# Patient Record
Sex: Female | Born: 1970
Health system: Southern US, Community
[De-identification: ages and names within clinical notes are randomized; demographics above are authoritative.]

## PROBLEM LIST (undated history)

## (undated) DIAGNOSIS — R87629 Unspecified abnormal cytological findings in specimens from vagina: Secondary | ICD-10-CM

## (undated) DIAGNOSIS — D219 Benign neoplasm of connective and other soft tissue, unspecified: Secondary | ICD-10-CM

## (undated) DIAGNOSIS — E119 Type 2 diabetes mellitus without complications: Secondary | ICD-10-CM

## (undated) DIAGNOSIS — E7800 Pure hypercholesterolemia, unspecified: Secondary | ICD-10-CM

## (undated) HISTORY — DX: Pure hypercholesterolemia, unspecified: E78.00

## (undated) HISTORY — DX: Benign neoplasm of connective and other soft tissue, unspecified: D21.9

## (undated) HISTORY — DX: Unspecified abnormal cytological findings in specimens from vagina: R87.629

---

## 1998-09-26 ENCOUNTER — Other Ambulatory Visit: Admission: RE | Admit: 1998-09-26 | Discharge: 1998-09-26 | Payer: Self-pay | Admitting: Family Medicine

## 1998-11-20 ENCOUNTER — Other Ambulatory Visit: Admission: RE | Admit: 1998-11-20 | Discharge: 1998-11-20 | Payer: Self-pay | Admitting: *Deleted

## 1999-09-25 ENCOUNTER — Other Ambulatory Visit: Admission: RE | Admit: 1999-09-25 | Discharge: 1999-09-25 | Payer: Self-pay | Admitting: Family Medicine

## 2000-09-01 ENCOUNTER — Other Ambulatory Visit: Admission: RE | Admit: 2000-09-01 | Discharge: 2000-09-01 | Payer: Self-pay | Admitting: Family Medicine

## 2001-12-20 ENCOUNTER — Other Ambulatory Visit: Admission: RE | Admit: 2001-12-20 | Discharge: 2001-12-20 | Payer: Self-pay | Admitting: Family Medicine

## 2003-01-11 ENCOUNTER — Other Ambulatory Visit: Admission: RE | Admit: 2003-01-11 | Discharge: 2003-01-11 | Payer: Self-pay | Admitting: Family Medicine

## 2005-12-30 ENCOUNTER — Ambulatory Visit (HOSPITAL_COMMUNITY): Admission: RE | Admit: 2005-12-30 | Discharge: 2005-12-30 | Payer: Self-pay | Admitting: Obstetrics

## 2007-11-25 ENCOUNTER — Ambulatory Visit (HOSPITAL_COMMUNITY): Admission: RE | Admit: 2007-11-25 | Discharge: 2007-11-25 | Payer: Self-pay | Admitting: Obstetrics

## 2015-07-03 ENCOUNTER — Ambulatory Visit (INDEPENDENT_AMBULATORY_CARE_PROVIDER_SITE_OTHER): Payer: BLUE CROSS/BLUE SHIELD | Admitting: Family Medicine

## 2015-07-03 VITALS — BP 106/64 | HR 84 | Temp 98.3°F | Resp 16 | Ht 64.0 in | Wt 208.0 lb

## 2015-07-03 DIAGNOSIS — E119 Type 2 diabetes mellitus without complications: Secondary | ICD-10-CM

## 2015-07-03 DIAGNOSIS — R252 Cramp and spasm: Secondary | ICD-10-CM

## 2015-07-03 DIAGNOSIS — R631 Polydipsia: Secondary | ICD-10-CM | POA: Diagnosis not present

## 2015-07-03 DIAGNOSIS — E669 Obesity, unspecified: Secondary | ICD-10-CM | POA: Diagnosis not present

## 2015-07-03 DIAGNOSIS — R358 Other polyuria: Secondary | ICD-10-CM

## 2015-07-03 DIAGNOSIS — H538 Other visual disturbances: Secondary | ICD-10-CM

## 2015-07-03 DIAGNOSIS — R3589 Other polyuria: Secondary | ICD-10-CM

## 2015-07-03 LAB — POCT CBC
GRANULOCYTE PERCENT: 45.7 % (ref 37–80)
HCT, POC: 37.2 % — AB (ref 37.7–47.9)
Hemoglobin: 13 g/dL (ref 12.2–16.2)
LYMPH, POC: 2.5 (ref 0.6–3.4)
MCH, POC: 28.6 pg (ref 27–31.2)
MCHC: 35 g/dL (ref 31.8–35.4)
MCV: 81.7 fL (ref 80–97)
MID (CBC): 0.2 (ref 0–0.9)
MPV: 9.3 fL (ref 0–99.8)
PLATELET COUNT, POC: 234 10*3/uL (ref 142–424)
POC Granulocyte: 2.3 (ref 2–6.9)
POC LYMPH %: 50.8 % — AB (ref 10–50)
POC MID %: 3.5 %M (ref 0–12)
RBC: 4.55 M/uL (ref 4.04–5.48)
RDW, POC: 14.3 %
WBC: 5 10*3/uL (ref 4.6–10.2)

## 2015-07-03 LAB — LIPID PANEL
CHOLESTEROL: 173 mg/dL (ref 125–200)
HDL: 43 mg/dL — ABNORMAL LOW (ref >=46)
LDL Cholesterol: 116 mg/dL (ref <130)
Total CHOL/HDL Ratio: 4 Ratio (ref <=5.0)
Triglycerides: 69 mg/dL (ref <150)
VLDL: 14 mg/dL (ref <30)

## 2015-07-03 LAB — COMPREHENSIVE METABOLIC PANEL
ALK PHOS: 111 U/L (ref 33–115)
ALT: 20 U/L (ref 6–29)
AST: 23 U/L (ref 10–30)
Albumin: 4.2 g/dL (ref 3.6–5.1)
BILIRUBIN TOTAL: 0.4 mg/dL (ref 0.2–1.2)
BUN: 10 mg/dL (ref 7–25)
CO2: 23 mmol/L (ref 20–31)
CREATININE: 0.69 mg/dL (ref 0.50–1.10)
Calcium: 9.6 mg/dL (ref 8.6–10.2)
Chloride: 100 mmol/L (ref 98–110)
GLUCOSE: 268 mg/dL — AB (ref 65–99)
Potassium: 4.7 mmol/L (ref 3.5–5.3)
SODIUM: 135 mmol/L (ref 135–146)
TOTAL PROTEIN: 7.8 g/dL (ref 6.1–8.1)

## 2015-07-03 LAB — MICROALBUMIN, URINE: MICROALB UR: 8.7 mg/dL

## 2015-07-03 LAB — GLUCOSE, POCT (MANUAL RESULT ENTRY): POC Glucose: 300 mg/dl — AB (ref 70–99)

## 2015-07-03 LAB — TSH: TSH: 2.113 u[IU]/mL (ref 0.350–4.500)

## 2015-07-03 LAB — POCT GLYCOSYLATED HEMOGLOBIN (HGB A1C): HEMOGLOBIN A1C: 11.2

## 2015-07-03 LAB — HEMOGLOBIN A1C: Hgb A1c MFr Bld: 11.2 % — AB (ref 4.0–6.0)

## 2015-07-03 MED ORDER — BLOOD GLUCOSE METER KIT: PACK | Status: DC

## 2015-07-03 MED ORDER — METFORMIN HCL 500 MG PO TABS: 500.0000 mg | ORAL_TABLET | Freq: Two times a day (BID) | ORAL | Status: DC

## 2015-07-03 NOTE — Patient Instructions (Addendum)
Read the American Diabetic Association website online. It has a lot of good material, and you should read as much as you can over and over.  Begin making dietary changes as discussed  Get daily exercise as discussed  Take the metformin 500 mg 1 daily before breakfast and before supper. Should you have forgotten to take it at the mealtime take it as soon as you remember to.   After about 2 weeks increase to 2 pills twice daily. If you get stomach upset or diarrhea from the medicine, gradually increase it on a slower schedule. The stomach usually adapts to this medicine.  Drink plenty of fluids  Take one 81 mg aspirin daily  Weight yourself regularly a couple of times a week and write down your weight. This will help you keep more disciplined in eating less and satting your goals of weight loss. You should be able to lose 1-2 pounds a week if you are being conscientious.  All diabetics are recommended to get a good eye examination. This should be done on a yearly basis.  Return in 3-4 weeks for a recheck.  Check your blood sugars on your home blood sugar meter and keep a record of them. Please try and check them before breakfast and 2-3 hours after your main dinner meal. The goal for the morning fasting sugar initially should be between 100 and 150 and in the evening less than 200. Ultimately [place] lower than that.

## 2015-07-03 NOTE — Progress Notes (Signed)
Patient ID: Haley Steele, female    DOB: 10/05/1970  Age: 44 y.o. MRN: [id number]  Chief Complaint  Patient presents with  . Diabetes    Saw OBG Mon and her sugar was 290-wants DM check     Subjective:   Patient is here for initial visit. She has been having problems with leg cramps. When she saw her OB/GYN he did additional labs on her and found her to have an elevated sugar. Told her that she had diabetes and needs to see a primary care doctor to get started on medication. She has never known that she was diabetic. There is no family history of diabetes. She has gained about 30 or 40 pounds over the last year by eating too much. She enjoys eating and shopping. She works a job. She lives alone. She has a couple of sisters but they do not have diabetes. Both parents are deceased and did not have diabetes that she knows of. She does not smoke, drink, or use substances. She does not do any regular exercise. She has had blurred vision, polydipsia, polyuria, and the cramping. She denies any headache or dizziness. No chest pains or palpitations. No shortness of breath. No GI or GU you complaints other than the urinary frequency. Musculoskeletal has been having cramps in her feet and legs as noted above. Dermatologic unremarkable. Neurologic unremarkable. Psychiatric unremarkable. Endocrinologic unremarkable. Allergy/immunology unremarkable. She works as an Optometrist for [employer].  Current allergies, medications, problem list, past/family and social histories reviewed.  Objective:  BP 106/64 mmHg  Pulse 84  Temp(Src) 98.3 F (36.8 C)  Resp 16  Ht 5\' 4"  (1.626 m)  Wt 208 lb (94.348 kg)  BMI 35.69 kg/m2  SpO2 96%  LMP 06/02/2015 (Approximate)  No major acute distress. Overweight lady. TMs normal. Eyes PERRLA. Throat clear. Neck supple without nodes or thyromegaly. No carotid bruits. Chest is clear to auscultation. Heart regular without murmurs gallops or arrhythmias. Abdomen  soft without mass or tenderness. Extremities without edema. Good pedal pulses. Sensory normal and the feet with normal filament test.  Assessment & Plan:   Assessment: 1. Type 2 diabetes mellitus without complication, without long-term current use of insulin ([place])   2. Polydipsia   3. Polyuria   4. Obesity   5. Blurred vision   6. Cramp of both lower extremities       Plan: Check labs. Had a 25 minute counseling discussion about diabetes, what it is, what needs to be worried about, etc.  Orders Placed This Encounter  Procedures  . Comprehensive metabolic panel  . Lipid panel  . TSH  . Microalbumin, urine  . POCT CBC  . POCT glucose (manual entry)  . POCT glycosylated hemoglobin (Hb A1C)   Results for orders placed or performed in visit on 07/03/15  POCT CBC  Result Value Ref Range   WBC 5.0 4.6 - 10.2 K/uL   Lymph, poc 2.5 0.6 - 3.4   POC LYMPH PERCENT 50.8 (A) 10 - 50 %L   MID (cbc) 0.2 0 - 0.9   POC MID % 3.5 0 - 12 %M   POC Granulocyte 2.3 2 - 6.9   Granulocyte percent 45.7 37 - 80 %G   RBC 4.55 4.04 - 5.48 M/uL   Hemoglobin 13.0 12.2 - 16.2 g/dL   HCT, POC 37.2 (A) 37.7 - 47.9 %   MCV 81.7 80 - 97 fL   MCH, POC 28.6 27 - 31.2 pg   MCHC 35.0 31.8 -  35.4 g/dL   RDW, POC 14.3 %   Platelet Count, POC 234 142 - 424 K/uL   MPV 9.3 0 - 99.8 fL  POCT glucose (manual entry)  Result Value Ref Range   POC Glucose 300 (A) 70 - 99 mg/dl  POCT glycosylated hemoglobin (Hb A1C)  Result Value Ref Range   Hemoglobin A1C 11.2         Patient Instructions  Read the American Diabetic Association website online. It has a lot of good material, and you should read as much as you can over and over.  Begin making dietary changes as discussed  Get daily exercise as discussed  Take the metformin 500 mg 1 daily before breakfast and before supper. Should you have forgotten to take it at the mealtime take it as soon as you remember to.   After about 2 weeks increase to 2 pills  twice daily. If you get stomach upset or diarrhea from the medicine, gradually increase it on a slower schedule. The stomach usually adapts to this medicine.  Drink plenty of fluids  Take one 81 mg aspirin daily  Weight yourself regularly a couple of times a week and write down your weight. This will help you keep more disciplined in eating less and satting your goals of weight loss. You should be able to lose 1-2 pounds a week if you are being conscientious.  All diabetics are recommended to get a good eye examination. This should be done on a yearly basis.  Return in 3-4 weeks for a recheck.  Check your blood sugars on your home blood sugar meter and keep a record of them. Please try and check them before breakfast and 2-3 hours after your main dinner meal. The goal for the morning fasting sugar initially should be between 100 and 150 and in the evening less than 200. Ultimately [place] lower than that.      Return in about 4 weeks (around 07/31/2015).   HOPPER,DAVID, MD 07/03/2015

## 2015-07-10 ENCOUNTER — Encounter: Payer: Self-pay | Admitting: Family Medicine

## 2015-08-19 ENCOUNTER — Ambulatory Visit (INDEPENDENT_AMBULATORY_CARE_PROVIDER_SITE_OTHER): Payer: BLUE CROSS/BLUE SHIELD | Admitting: Family Medicine

## 2015-08-19 VITALS — BP 114/60 | HR 77 | Temp 97.9°F | Resp 18 | Ht 63.5 in | Wt 206.2 lb

## 2015-08-19 DIAGNOSIS — E119 Type 2 diabetes mellitus without complications: Secondary | ICD-10-CM | POA: Diagnosis not present

## 2015-08-19 LAB — POCT GLYCOSYLATED HEMOGLOBIN (HGB A1C): HEMOGLOBIN A1C: 9.1

## 2015-08-19 MED ORDER — ONETOUCH LANCETS MISC: Status: DC

## 2015-08-19 MED ORDER — METFORMIN HCL 500 MG PO TABS: 500.0000 mg | ORAL_TABLET | Freq: Two times a day (BID) | ORAL | Status: DC

## 2015-08-19 MED ORDER — GLUCOSE BLOOD VI STRP: ORAL_STRIP | Status: DC

## 2015-08-19 NOTE — Progress Notes (Signed)
Patient ID: Haley Steele, female    DOB: November 12, 1970  Age: 45 y.o. MRN: [id number]  Chief Complaint  Patient presents with  . Follow-up    diabetes    Subjective:   Here for a recheck of her diabetes. She was newly diagnosed in November. She is feeling much better. No longer having the polydipsia and polyuria. She has lost little weight though today she is bundled in cold weather closed. Her sugars run good at home. She needs some new supplies. She's been checking her sugars about 4 times a day. She showed me her meter and they are consistently doing well.  Current allergies, medications, problem list, past/family and social histories reviewed.  Objective:  BP 114/60 mmHg  Pulse 77  Temp(Src) 97.9 F (36.6 C) (Oral)  Resp 18  Ht 5' 3.5" (1.613 m)  Wt 206 lb 3.2 oz (93.532 kg)  BMI 35.95 kg/m2  SpO2 93%  Chest clear. Heart regular.  Results for orders placed or performed in visit on 08/19/15  POCT glycosylated hemoglobin (Hb A1C)  Result Value Ref Range   Hemoglobin A1C 9.1      Assessment & Plan:   Assessment: 1. Type 2 diabetes mellitus without complication, without long-term current use of insulin ([place])       Plan:   Orders Placed This Encounter  Procedures  . POCT glycosylated hemoglobin (Hb A1C)    Meds ordered this encounter  Medications  . metFORMIN (GLUCOPHAGE) 500 MG tablet    Sig: Take 1 tablet (500 mg total) by mouth 2 (two) times daily with a meal. Increase as directed.    Dispense:  180 tablet    Refill:  3  . glucose blood test strip    Sig: Use as instructed    Dispense:  100 each    Refill:  12    Needs One Touch Ultra Strips.  Look at her box.  . ONE TOUCH LANCETS MISC    Sig: Lancets    Dispense:  200 each    Refill:  12    One touch delica         Patient Instructions  Same treatment. Return in 3 months.     Return in about 3 months (around 11/17/2015).   Haley Gerber, MD 08/19/2015

## 2015-08-19 NOTE — Patient Instructions (Signed)
Same treatment. Return in 3 months.

## 2015-10-04 ENCOUNTER — Telehealth: Payer: Self-pay

## 2015-10-04 DIAGNOSIS — E119 Type 2 diabetes mellitus without complications: Secondary | ICD-10-CM

## 2015-10-04 MED ORDER — METFORMIN HCL 500 MG PO TABS: 1000.0000 mg | ORAL_TABLET | Freq: Two times a day (BID) | ORAL | Status: DC

## 2015-10-04 NOTE — Telephone Encounter (Signed)
Pt called and asked [place] to send in a new Rx for the increased dose of metformin that Dr Linna Darner had instr'd her to take, stating that she is now taking 2 tabs BID for total of #360 for 90 day supply. I checked plan under OV notes which stated: "After about 2 weeks increase to 2 pills twice daily". Sent in new Rx with a year of RFs as Dr hopper had given her at [facility] lower dose.

## 2016-02-10 DIAGNOSIS — Z01419 Encounter for gynecological examination (general) (routine) without abnormal findings: Secondary | ICD-10-CM | POA: Diagnosis not present

## 2016-02-10 DIAGNOSIS — E669 Obesity, unspecified: Secondary | ICD-10-CM | POA: Diagnosis not present

## 2016-02-10 DIAGNOSIS — D259 Leiomyoma of uterus, unspecified: Secondary | ICD-10-CM | POA: Diagnosis not present

## 2016-02-10 DIAGNOSIS — Z130 Encounter for screening for diseases of the blood and blood-forming organs and certain disorders involving the immune mechanism: Secondary | ICD-10-CM | POA: Diagnosis not present

## 2016-02-10 DIAGNOSIS — Z1389 Encounter for screening for other disorder: Secondary | ICD-10-CM | POA: Diagnosis not present

## 2016-02-10 DIAGNOSIS — Z124 Encounter for screening for malignant neoplasm of cervix: Secondary | ICD-10-CM | POA: Diagnosis not present

## 2016-06-16 DIAGNOSIS — Z1231 Encounter for screening mammogram for malignant neoplasm of breast: Secondary | ICD-10-CM | POA: Diagnosis not present

## 2016-07-08 DIAGNOSIS — L7211 Pilar cyst: Secondary | ICD-10-CM | POA: Diagnosis not present

## 2016-07-16 DIAGNOSIS — M7981 Nontraumatic hematoma of soft tissue: Secondary | ICD-10-CM | POA: Diagnosis not present

## 2016-07-20 ENCOUNTER — Other Ambulatory Visit: Payer: Self-pay | Admitting: Family Medicine

## 2016-07-20 DIAGNOSIS — E119 Type 2 diabetes mellitus without complications: Secondary | ICD-10-CM

## 2017-02-15 DIAGNOSIS — Z30011 Encounter for initial prescription of contraceptive pills: Secondary | ICD-10-CM | POA: Diagnosis not present

## 2017-02-15 DIAGNOSIS — Z3202 Encounter for pregnancy test, result negative: Secondary | ICD-10-CM | POA: Diagnosis not present

## 2017-02-15 DIAGNOSIS — Z124 Encounter for screening for malignant neoplasm of cervix: Secondary | ICD-10-CM | POA: Diagnosis not present

## 2017-02-15 DIAGNOSIS — D259 Leiomyoma of uterus, unspecified: Secondary | ICD-10-CM | POA: Diagnosis not present

## 2017-02-15 DIAGNOSIS — Z1389 Encounter for screening for other disorder: Secondary | ICD-10-CM | POA: Diagnosis not present

## 2017-02-15 DIAGNOSIS — Z130 Encounter for screening for diseases of the blood and blood-forming organs and certain disorders involving the immune mechanism: Secondary | ICD-10-CM | POA: Diagnosis not present

## 2017-02-15 DIAGNOSIS — Z01419 Encounter for gynecological examination (general) (routine) without abnormal findings: Secondary | ICD-10-CM | POA: Diagnosis not present

## 2017-02-15 DIAGNOSIS — E041 Nontoxic single thyroid nodule: Secondary | ICD-10-CM | POA: Diagnosis not present

## 2017-03-23 ENCOUNTER — Encounter: Payer: Self-pay | Admitting: Family Medicine

## 2017-03-23 ENCOUNTER — Ambulatory Visit (INDEPENDENT_AMBULATORY_CARE_PROVIDER_SITE_OTHER): Payer: BLUE CROSS/BLUE SHIELD | Admitting: Family Medicine

## 2017-03-23 VITALS — BP 122/80 | HR 82 | Temp 98.6°F | Resp 18 | Ht 63.5 in | Wt 213.4 lb

## 2017-03-23 DIAGNOSIS — E1165 Type 2 diabetes mellitus with hyperglycemia: Secondary | ICD-10-CM | POA: Diagnosis not present

## 2017-03-23 DIAGNOSIS — R030 Elevated blood-pressure reading, without diagnosis of hypertension: Secondary | ICD-10-CM

## 2017-03-23 DIAGNOSIS — H1033 Unspecified acute conjunctivitis, bilateral: Secondary | ICD-10-CM | POA: Diagnosis not present

## 2017-03-23 DIAGNOSIS — E119 Type 2 diabetes mellitus without complications: Secondary | ICD-10-CM | POA: Insufficient documentation

## 2017-03-23 MED ORDER — GENTAMICIN SULFATE 0.3 % OP SOLN: 2.0000 [drp] | OPHTHALMIC | 0 refills | Status: DC

## 2017-03-23 NOTE — Patient Instructions (Addendum)
I suspect this is viral but we are going to cover you with an antibiotic drop just in case. Make sure you get in all 6 doses of the antibiotic drops today and tomorrow so that we can get you back to work on Thursday - so today put in 1 drop into each eye every hour x 6 doses. Tomorrow put in 1 drop into each eye 6 times (breafast, mid-morning, lunch, mid-afternoon, dinner, before bed). Then continue every 4hours except while sleeping until sxs have been completely resolved for 2d.    If you are not getting significant improvement by Thursday, you can fill the rx for azelastine and start that as well.  Make sure you are doing warm wet compresses as often as possible - especially before the antibiotic drops - it will make them more effective. Make sure to wash your hands immed or hand sanitize anytime you touch your face.    IF you received an x-ray today, you will receive an invoice from [facility]. Please contact [facility] at [phone] with questions or concerns regarding your invoice.   IF you received labwork today, you will receive an invoice from [facility]. Please contact [facility] at [phone] with questions or concerns regarding your invoice.   Our billing staff will not be able to assist you with questions regarding bills from these companies.  You will be contacted with the lab results as soon as they are available. The fastest way to get your results is to activate your My Chart account. Instructions are located on the last page of this paperwork. If you have not heard from [place] regarding the results in 2 weeks, please contact this office.      Viral Conjunctivitis, Adult Viral conjunctivitis is an inflammation of the clear membrane that covers the white part of your eye and the inner surface of your eyelid (conjunctiva). The inflammation is caused by a viral infection. The blood vessels in the conjunctiva become inflamed, causing the eye to become red or pink, and  often itchy. Viral conjunctivitis can be easily passed from one person to another (is contagious). This condition is often called pink eye. What are the causes? This condition is caused by a virus. A virus is a type of contagious germ. It can be spread by touching objects that have been contaminated with the virus, such as doorknobs or towels. It can also be passed through droplets, such as from coughing or sneezing. What are the signs or symptoms? Symptoms of this condition include:  Eye redness.  Tearing or watery eyes.  Itchy and irritated eyes.  Burning feeling in the eyes.  Clear drainage from the eye.  Swollen eyelids.  A gritty feeling in the eye.  Light sensitivity.  This condition often occurs with other symptoms, such as a fever, nausea, or a rash. How is this diagnosed? This condition is diagnosed with a medical history and physical exam. If you have discharge from your eye, the discharge may be tested to rule out other causes of conjunctivitis. How is this treated? Viral conjunctivitis does not respond to medicines that kill bacteria (antibiotics). Treatment for viral conjunctivitis is directed at stopping a bacterial infection from developing in addition to the viral infection. Treatment also aims to relieve your symptoms, such as itching. This may be done with antihistamine drops or other eye medicines. Rarely, steroid eye drops or antiviral medicines may be prescribed. Follow these instructions at home: Medicines   Take or apply over-the-counter and prescription medicines only  as told by your health care provider.  Be very careful to avoid touching the edge of the eyelid with the eye drop bottle or ointment tube when applying medicines to the affected eye. Being careful this way will stop you from spreading the infection to the other eye or to other people. Eye care  Avoid touching or rubbing your eyes.  Apply a warm, wet, clean washcloth to your eye for 10-20  minutes, 3-4 times per day or as told by your health care provider.  If you wear contact lenses, do not wear them until the inflammation is gone and your health care provider says it is safe to wear them again. Ask your health care provider how to sterilize or replace your contact lenses before using them again. Wear glasses until you can resume wearing contacts.  Avoid wearing eye makeup until the inflammation is gone. Throw away any old eye cosmetics that may be contaminated.  Gently wipe away any drainage from your eye with a warm, wet washcloth or a cotton ball. General instructions  Change or wash your pillowcase every day or as told by your health care provider.  Do not share towels, pillowcases, washcloths, eye makeup, makeup brushes, contact lenses, or glasses. This may spread the infection.  Wash your hands often with soap and water. Use paper towels to dry your hands. If soap and water are not available, use hand sanitizer.  Try to avoid contact with other people for one week or as told by your health care provider. Contact a health care provider if:  Your symptoms do not improve with treatment or they get worse.  You have increased pain.  Your vision becomes blurry.  You have a fever.  You have facial pain, redness, or swelling.  You have yellow or green drainage coming from your eye.  You have new symptoms. This information is not intended to replace advice given to you by your health care provider. Make sure you discuss any questions you have with your health care provider. Document Released: 10/17/2002 Document Revised: 02/22/2016 Document Reviewed: 02/11/2016 Elsevier Interactive Patient Education  2017 [employer].

## 2017-03-23 NOTE — Progress Notes (Addendum)
Subjective:    Patient ID: Haley Steele, female    DOB: 07/30/1971, 46 y.o.   MRN: [id number] Chief Complaint  Patient presents with  . Conjunctivitis    both eyes started 5days ago but got worse 3days ago     HPI  Haley Steele is a delightful 46 yo woman who presents today w/ c/o eye redness and drainage. Started in right eye 5d ago and then 2d prior moved into left as well. No vision change. Drainage clear. No pain but a little itchy and gritty. No chance she could have scratched her eyes.  No URIs.  Does not wear glasses or contacts. Does not use eye makeup at all.  No known sick contacts but did start the day after she tried on glasses at [employer].  No past medical history on file. No past surgical history on file. Current Outpatient Prescriptions on File Prior to Visit  Medication Sig Dispense Refill  . blood glucose meter kit and supplies Dispense based on patient and insurance preference. Use up to four times daily as directed. (FOR ICD-9 250.00, 250.01). 1 each 0  . glucose blood test strip Use as instructed 100 each 12  . metFORMIN (GLUCOPHAGE) 500 MG tablet Take 2 tablets (1,000 mg total) by mouth 2 (two) times daily with a meal. 360 tablet 3  . ONE TOUCH LANCETS MISC Lancets 200 each 12   No current facility-administered medications on file prior to visit.    No Known Allergies No family history on file. Social History   Social History  . Marital status: Divorced    Spouse name: N/A  . Number of children: N/A  . Years of education: N/A   Social History Main Topics  . Smoking status: Never Smoker  . Smokeless tobacco: Never Used  . Alcohol use 0.0 oz/week  . Drug use: No  . Sexual activity: Not Asked   Other Topics Concern  . None   Social History Narrative  . None   Depression screen [facility] 2/9 03/23/2017 08/19/2015 07/03/2015  Decreased Interest 0 0 0  Down, Depressed, Hopeless 0 0 0  PHQ - 2 Score 0 0 0     Review of Systems See hpi      Objective:   Physical Exam  Constitutional: She is oriented to person, place, and time. She appears well-developed and well-nourished. No distress.  HENT:  Head: Normocephalic and atraumatic.  Right Ear: Tympanic membrane, external ear and ear canal normal.  Left Ear: Tympanic membrane, external ear and ear canal normal.  Nose: Nose normal. No mucosal edema or rhinorrhea.  Mouth/Throat: Uvula is midline, oropharynx is clear and moist and mucous membranes are normal. No oropharyngeal exudate.  Eyes: Pupils are equal, round, and reactive to light. EOM are normal. Right eye exhibits discharge. Right eye exhibits no chemosis and no exudate. Left eye exhibits discharge. Left eye exhibits no chemosis and no exudate. Right conjunctiva is injected. Left conjunctiva is injected. No scleral icterus.  Fundoscopic exam:      The right eye shows no exudate, no hemorrhage and no papilledema.       The left eye shows no exudate, no hemorrhage and no papilledema.  Bilateral watery discharge  Neck: Normal range of motion. Neck supple.  Cardiovascular: Normal rate, regular rhythm, normal heart sounds and intact distal pulses.   Pulmonary/Chest: Effort normal and breath sounds normal.  Lymphadenopathy:    She has no cervical adenopathy.  Neurological: She is alert and oriented to person, place,  and time.  Skin: Skin is warm and dry. She is not diaphoretic. No erythema.  Psychiatric: She has a normal mood and affect. Her behavior is normal.    BP 122/80   Pulse 82   Temp 98.6 F (37 C) (Oral)   Resp 18   Ht 5' 3.5" (1.613 m)   Wt 213 lb 6.4 oz (96.8 kg)   LMP 03/09/2017   SpO2 98%   BMI 37.21 kg/m       Visual Acuity Screening   Right eye Left eye Both eyes  Without correction:     With correction: '20/15 20/15 20/20 '    Assessment & Plan:   1. Acute bacterial conjunctivitis of both eyes   2. Type 2 diabetes mellitus with hyperglycemia, without long-term current use of insulin ([facility])   3.  Elevated blood pressure reading      Meds ordered this encounter  Medications  . norethindrone (HEATHER) 0.35 MG tablet    Sig: Take 1 tablet by mouth daily.  Marland Kitchen gentamicin (GARAMYCIN) 0.3 % ophthalmic solution    Sig: Place 2 drops into both eyes every 4 (four) hours.    Dispense:  15 mL    Refill:  0   Hand written rx for azelastine given 1 gtt into eye bid start in 2d if no substantial improvement on above  Delman Cheadle, M.D.  Primary Care at [facility] [address] [place], [place] [address] [phone] phone [phone] fax  03/25/17 11:31 PM

## 2017-03-23 NOTE — Progress Notes (Deleted)
   Subjective:    Patient ID: Haley Steele, female    DOB: August 02, 1971, 46 y.o.   MRN: [id number]  HPI    Review of Systems     Objective:   Physical Exam    BP (!) 144/84   Pulse 82   Temp 98.6 F (37 C) (Oral)   Resp 18   Ht 5' 3.5" (1.613 m)   Wt 213 lb 6.4 oz (96.8 kg)   LMP 03/09/2017   SpO2 98%   BMI 37.21 kg/m      Assessment & Plan:

## 2017-03-24 DIAGNOSIS — E041 Nontoxic single thyroid nodule: Secondary | ICD-10-CM | POA: Diagnosis not present

## 2017-03-26 ENCOUNTER — Other Ambulatory Visit (HOSPITAL_COMMUNITY): Payer: Self-pay | Admitting: Obstetrics and Gynecology

## 2017-03-26 DIAGNOSIS — E041 Nontoxic single thyroid nodule: Secondary | ICD-10-CM

## 2017-03-31 ENCOUNTER — Ambulatory Visit (HOSPITAL_COMMUNITY)
Admission: RE | Admit: 2017-03-31 | Discharge: 2017-03-31 | Disposition: A | Discharge status: Home / Self Care | Payer: BLUE CROSS/BLUE SHIELD | Source: Ambulatory Visit | Attending: Obstetrics and Gynecology | Admitting: Obstetrics and Gynecology

## 2017-03-31 DIAGNOSIS — E041 Nontoxic single thyroid nodule: Secondary | ICD-10-CM | POA: Insufficient documentation

## 2017-04-06 ENCOUNTER — Other Ambulatory Visit: Payer: Self-pay | Admitting: Obstetrics and Gynecology

## 2017-04-06 DIAGNOSIS — E041 Nontoxic single thyroid nodule: Secondary | ICD-10-CM

## 2017-04-21 ENCOUNTER — Ambulatory Visit
Admission: RE | Admit: 2017-04-21 | Discharge: 2017-04-21 | Disposition: A | Discharge status: Home / Self Care | Payer: BLUE CROSS/BLUE SHIELD | Source: Ambulatory Visit | Attending: Obstetrics and Gynecology | Admitting: Obstetrics and Gynecology

## 2017-04-21 ENCOUNTER — Other Ambulatory Visit (HOSPITAL_COMMUNITY)
Admission: RE | Admit: 2017-04-21 | Discharge: 2017-04-21 | Disposition: A | Discharge status: Home / Self Care | Payer: BLUE CROSS/BLUE SHIELD | Source: Ambulatory Visit | Attending: General Surgery | Admitting: General Surgery

## 2017-04-21 ENCOUNTER — Other Ambulatory Visit: Payer: Self-pay | Admitting: Obstetrics and Gynecology

## 2017-04-21 DIAGNOSIS — E041 Nontoxic single thyroid nodule: Secondary | ICD-10-CM

## 2017-04-21 DIAGNOSIS — R946 Abnormal results of thyroid function studies: Secondary | ICD-10-CM | POA: Diagnosis not present

## 2017-10-15 DIAGNOSIS — Z113 Encounter for screening for infections with a predominantly sexual mode of transmission: Secondary | ICD-10-CM | POA: Diagnosis not present

## 2017-10-15 DIAGNOSIS — N762 Acute vulvitis: Secondary | ICD-10-CM | POA: Diagnosis not present

## 2017-10-25 ENCOUNTER — Other Ambulatory Visit (HOSPITAL_COMMUNITY): Payer: Self-pay | Admitting: Obstetrics and Gynecology

## 2017-10-25 DIAGNOSIS — E041 Nontoxic single thyroid nodule: Secondary | ICD-10-CM

## 2017-11-04 DIAGNOSIS — R202 Paresthesia of skin: Secondary | ICD-10-CM | POA: Insufficient documentation

## 2017-11-07 ENCOUNTER — Encounter: Payer: Self-pay | Admitting: Emergency Medicine

## 2017-11-07 ENCOUNTER — Emergency Department (INDEPENDENT_AMBULATORY_CARE_PROVIDER_SITE_OTHER)
Admission: EM | Admit: 2017-11-07 | Discharge: 2017-11-07 | Disposition: A | Discharge status: Home / Self Care | Payer: BLUE CROSS/BLUE SHIELD | Source: Home / Self Care | Attending: Emergency Medicine | Admitting: Emergency Medicine

## 2017-11-07 ENCOUNTER — Other Ambulatory Visit: Payer: Self-pay

## 2017-11-07 DIAGNOSIS — R6889 Other general symptoms and signs: Secondary | ICD-10-CM | POA: Diagnosis not present

## 2017-11-07 HISTORY — DX: Type 2 diabetes mellitus without complications: E11.9

## 2017-11-07 NOTE — ED Triage Notes (Signed)
Patient has felt fatigued, aching, with occasional chills and slight cough for past 3 days.

## 2017-11-07 NOTE — ED Provider Notes (Signed)
[name]    CSN: [id number] Arrival date & time: 11/07/17  1803  Patient presents to [facility] Sunday, 6:03 PM   History   Chief Complaint Chief Complaint  Patient presents with  . Generalized Body Aches  . Fatigue    HPI Haley Steele is a 47 y.o. female.   HPI Symptoms started 4 days ago.  She had just returned from [facility] 4 days ago, onset of fever, chills, sweats, myalgias.  Nonproductive cough. These symptoms were moderately severe and lasted 2 days, then improved.  Last fever was over 24 hours ago. She complains of vague fatigue, mild myalgias, without swollen joints or rash.  No history of tick or insect bite. No wheezing or chest pain or shortness of breath or abdominal pain or nausea or vomiting or GU symptoms. Denies blurry vision or polyuria or polydipsia Past Medical History:  Diagnosis Date  . Diabetes mellitus without complication [facility])   Per patient questioning, she states that she has prediabetes  Patient Active Problem List   Diagnosis Date Noted  . Type 2 diabetes mellitus ([place]) 03/23/2017    History reviewed. No pertinent surgical history. Denies any surgeries OB History   None    LMP 10/24/17.  Denies chance of pregnancy  Home Medications    Prior to Admission medications   Medication Sig Start Date End Date Taking? Authorizing Provider  blood glucose meter kit and supplies Dispense based on patient and insurance preference. Use up to four times daily as directed. (FOR ICD-9 250.00, 250.01). 07/03/15   Posey Boyer, MD  gentamicin (GARAMYCIN) 0.3 % ophthalmic solution Place 2 drops into both eyes every 4 (four) hours. 03/23/17   Shawnee Knapp, MD  glucose blood test strip Use as instructed 08/19/15   Posey Boyer, MD  metFORMIN (GLUCOPHAGE) 500 MG tablet Take 2 tablets (1,000 mg total) by mouth 2 (two) times daily with a meal. 10/04/15   Posey Boyer, MD  norethindrone (HEATHER) 0.35 MG tablet Take 1  tablet by mouth daily.    [provider]  ONE TOUCH LANCETS MISC Lancets 08/19/15   Posey Boyer, MD    Family History No family history on file.  Social History Social History   Tobacco Use  . Smoking status: Never Smoker  . Smokeless tobacco: Never Used  Substance Use Topics  . Alcohol use: Yes    Alcohol/week: 0.0 oz  . Drug use: No     Allergies   Patient has no known allergies.   Review of Systems Review of Systems  Constitutional: Negative for diaphoresis.  HENT: Positive for congestion (Mild). Negative for nosebleeds, rhinorrhea, sinus pain, sore throat and trouble swallowing.   Eyes: Negative.   Respiratory: Positive for cough. Negative for chest tightness, shortness of breath and wheezing.   Cardiovascular: Negative for chest pain, palpitations and leg swelling.  Gastrointestinal: Negative for abdominal pain, diarrhea, nausea and vomiting.  Genitourinary: Negative.   Musculoskeletal: Positive for myalgias. Negative for neck stiffness.  Neurological: Negative for dizziness, seizures, syncope and light-headedness.  Hematological: Negative for adenopathy.  Psychiatric/Behavioral: Negative for confusion and hallucinations.  All other systems reviewed and are negative.    Physical Exam Triage Vital Signs ED Triage Vitals  Enc Vitals Group     BP 11/07/17 1820 119/80     Pulse Rate 11/07/17 1820 88     Resp 11/07/17 1820 16     Temp 11/07/17 1820 97.9 F (36.6 C)  Temp Source 11/07/17 1820 Oral     SpO2 11/07/17 1820 98 %     Weight 11/07/17 1821 176 lb (79.8 kg)     Height 11/07/17 1821 '5\' 4"'  (1.626 m)     Head Circumference --      Peak Flow --      Pain Score 11/07/17 1820 1     Pain Loc --      Pain Edu? --      Excl. in [place]? --    No data found.  Updated Vital Signs BP 119/80 (BP Location: Right Arm)   Pulse 88   Temp 97.9 F (36.6 C) (Oral)   Resp 16   Ht '5\' 4"'  (1.626 m)   Wt 176 lb (79.8 kg)   LMP 10/24/2017 (Exact Date)    SpO2 98%   BMI 30.21 kg/m   Visual Acuity Right Eye Distance:   Left Eye Distance:   Bilateral Distance:    Right Eye Near:   Left Eye Near:    Bilateral Near:     Physical Exam  Constitutional: She is oriented to person, place, and time. She appears well-developed and well-nourished. No distress.  HENT:  Head: Normocephalic and atraumatic.  Nose: Nose normal.  Mouth/Throat: Oropharynx is clear and moist.  Ears normal  Eyes: Pupils are equal, round, and reactive to light. Conjunctivae are normal. No scleral icterus.  Neck: Normal range of motion. Neck supple. No JVD present.  Cardiovascular: Normal rate and regular rhythm.  No murmur heard. Pulmonary/Chest: Effort normal and breath sounds normal.  Occasional cough noted  Abdominal: She exhibits no distension. There is no tenderness.  Lymphadenopathy:    She has no cervical adenopathy.  Neurological: She is alert and oriented to person, place, and time. No cranial nerve deficit.  Skin: Skin is warm and dry. Capillary refill takes less than 2 seconds. No rash noted. She is not diaphoretic.  Psychiatric: She has a normal mood and affect. Her behavior is normal.  Vitals reviewed.    UC Treatments / Results  Labs (all labs ordered are listed, but only abnormal results are displayed) Labs Reviewed  POCT INFLUENZA A/B    EKG None Radiology No results found.  Procedures Procedures (including critical care time)  Medications Ordered in UC Medications - No data to display   Initial Impression / Assessment and Plan / UC Course  I have reviewed the triage vital signs and the nursing notes.  Pertinent labs & imaging results that were available during my care of the patient were reviewed by me and considered in my medical decision making (see chart for details).     Rapid flu test negative. He declined any other testing. She likely has viral syndrome which is improving on her own. We discussed OTC treatment and  symptomatic care. Red flags discussed. Urged her to follow-up with PCP this week to follow-up prediabetes and other preventive and primary care  Final Clinical Impressions(s) / UC Diagnoses   Final diagnoses:  Flu-like symptoms    ED Discharge Orders    None       Controlled Substance Prescriptions [place] Controlled Substance Registry consulted? Not Applicable   Jacqulyn Cane, MD 11/07/17 2109

## 2017-11-08 LAB — POCT INFLUENZA A/B
Influenza A, POC: NEGATIVE
Influenza B, POC: NEGATIVE

## 2017-11-10 ENCOUNTER — Ambulatory Visit (HOSPITAL_COMMUNITY): Admission: RE | Admit: 2017-11-10 | Payer: BLUE CROSS/BLUE SHIELD | Source: Ambulatory Visit

## 2017-11-18 ENCOUNTER — Ambulatory Visit (INDEPENDENT_AMBULATORY_CARE_PROVIDER_SITE_OTHER): Payer: BLUE CROSS/BLUE SHIELD | Admitting: Osteopathic Medicine

## 2017-11-18 ENCOUNTER — Encounter: Payer: Self-pay | Admitting: Osteopathic Medicine

## 2017-11-18 VITALS — BP 135/83 | HR 82 | Temp 98.0°F | Ht 64.0 in | Wt 177.1 lb

## 2017-11-18 DIAGNOSIS — R51 Headache: Secondary | ICD-10-CM | POA: Diagnosis not present

## 2017-11-18 DIAGNOSIS — R202 Paresthesia of skin: Secondary | ICD-10-CM

## 2017-11-18 DIAGNOSIS — R200 Anesthesia of skin: Secondary | ICD-10-CM | POA: Diagnosis not present

## 2017-11-18 DIAGNOSIS — E119 Type 2 diabetes mellitus without complications: Secondary | ICD-10-CM

## 2017-11-18 DIAGNOSIS — Z3041 Encounter for surveillance of contraceptive pills: Secondary | ICD-10-CM | POA: Diagnosis not present

## 2017-11-18 DIAGNOSIS — R519 Headache, unspecified: Secondary | ICD-10-CM

## 2017-11-18 DIAGNOSIS — Z8639 Personal history of other endocrine, nutritional and metabolic disease: Secondary | ICD-10-CM | POA: Diagnosis not present

## 2017-11-18 DIAGNOSIS — G8929 Other chronic pain: Secondary | ICD-10-CM

## 2017-11-18 NOTE — Progress Notes (Signed)
HPI: Haley Steele is a 47 y.o. female who  has a past medical history of Diabetes mellitus without complication ([place]).  she presents to [facility] today, 11/18/17,  for chief complaint of: Establish care Check-up   History of elevated sugars: Prediabetes per patient, was on metformin which caused some problems for her. On my review of the A1c, we discussed that these are typically in the diabetic range in her case.  History of thyroid nodule: Biopsy was normal.  Reports increased thirst lately.eports occasional blurry ision, occasionally normal. No recent eye exam. Headaches come and go, no stabbing pain or vision change with headaches.  Reports some numbness and tingling issues occasionally into lateral/anterior legs, sometimes in fingers. No history of neck or back injury, no neck or back pain now.    Past medical, surgical, social and family history reviewed:  Patient Active Problem List   Diagnosis Date Noted  . Abnormal Pap smear of cervix 11/21/2017  . Uterine leiomyoma 11/21/2017  . Headache 11/21/2017  . Tingling 11/04/2017  . Type 2 diabetes mellitus ([place]) 03/23/2017   History reviewed. No pertinent surgical history.  Social History   Tobacco Use  . Smoking status: Never Smoker  . Smokeless tobacco: Never Used  Substance Use Topics  . Alcohol use: Yes    Alcohol/week: 0.0 oz    History reviewed. No pertinent family history.   Current medication list and allergy/intolerance information reviewed:    Current Outpatient Medications  Medication Sig Dispense Refill  . blood glucose meter kit and supplies Dispense based on patient and insurance preference. Use up to four times daily as directed. (FOR ICD-9 250.00, 250.01). 1 each 0  . gentamicin (GARAMYCIN) 0.3 % ophthalmic solution Place 2 drops into both eyes every 4 (four) hours. 15 mL 0  . glucose blood test strip Use as instructed 100 each 12  . metFORMIN  (GLUCOPHAGE) 500 MG tablet Take 2 tablets (1,000 mg total) by mouth 2 (two) times daily with a meal. 360 tablet 3  . norethindrone (HEATHER) 0.35 MG tablet Take 1 tablet by mouth daily.    . ONE TOUCH LANCETS MISC Lancets 200 each 12   No current facility-administered medications for this visit.     No Known Allergies    Review of Systems:  Constitutional:  No  fever, +chills, No recent illness, +unintentional weight changes. +significant fatigue.   HEENT: +headache, +vision change, no hearing change, No sore throat, No  sinus pressure  Cardiac: No  chest pain, No  pressure, No palpitations, No  Orthopnea  Respiratory:  No  shortness of breath. +Cough  Gastrointestinal: No  abdominal pain, No  nausea, No  vomiting,  No  blood in stool, No  diarrhea, +constipation   Musculoskeletal: + myalgia/arthralgia  Skin: No  Rash, No other wounds/concerning lesions  Genitourinary: No  incontinence, +abnormal genital bleeding/periods, No abnormal genital discharge, + hematuria   Hem/Onc: No  easy bruising/bleeding, No  abnormal lymph node  Endocrine: No cold intolerance,  No heat intolerance. No polyuria, + polydipsia, polyphagia   Neurologic: No  weakness, No  dizziness, No  slurred speech/focal weakness/facial droop  Psychiatric: No  concerns with depression, No  concerns with anxiety, No sleep problems, No mood problems  Exam:  BP 135/83 (BP Location: Left Arm, Patient Position: Sitting, Cuff Size: Normal)   Pulse 82   Temp 98 F (36.7 C) (Oral)   Ht '5\' 4"'  (1.626 m)   Wt 177 lb 1.9 oz (  80.3 kg)   LMP 10/24/2017 (Exact Date)   BMI 30.40 kg/m   Constitutional: VS see above. General Appearance: alert, well-developed, well-nourished, NAD  Eyes: Normal lids and conjunctive, non-icteric sclera  Ears, Nose, Mouth, Throat: MMM, Normal external inspection ears/nares/mouth/lips/gums.   Neck: No masses, trachea midline. No thyroid enlargement. No tenderness/mass appreciated. No  lymphadenopathy  Respiratory: Normal respiratory effort. no wheeze, no rhonchi, no rales  Cardiovascular: S1/S2 normal, no murmur, no rub/gallop auscultated. RRR. No lower extremity edema.   Gastrointestinal: Nontender, no masses. No hepatomegaly, no splenomegaly. No hernia appreciated. Bowel sounds normal. Rectal exam deferred.   Musculoskeletal: Gait normal.   Neurological: Normal balance/coordination. No tremor. No cranial nerve deficit on limited exam. Motor and sensation intact and symmetric. Cerebellar reflexes intact.   Skin: warm, dry, intact. No rash/ulcer. No concerning nevi or subq nodules on limited exam.    Psychiatric: Normal judgment/insight. Normal mood and affect. Oriented x3.    Results for orders placed or performed in visit on 11/18/17 (from the past 72 hour(s))  CBC     Status: None   Collection Time: 11/19/17  8:18 AM  Result Value Ref Range   WBC 4.8 3.8 - 10.8 Thousand/uL   RBC 4.52 3.80 - 5.10 Million/uL   Hemoglobin 12.3 11.7 - 15.5 g/dL   HCT 38.0 35.0 - 45.0 %   MCV 84.1 80.0 - 100.0 fL   MCH 27.2 27.0 - 33.0 pg   MCHC 32.4 32.0 - 36.0 g/dL   RDW 14.2 11.0 - 15.0 %   Platelets 346 140 - 400 Thousand/uL   MPV 12.0 7.5 - 12.5 fL  COMPLETE METABOLIC PANEL WITH GFR     Status: Abnormal   Collection Time: 11/19/17  8:18 AM  Result Value Ref Range   Glucose, Bld 345 (H) 65 - 99 mg/dL    Comment: .            Fasting reference interval . For someone without known diabetes, a glucose value >125 mg/dL indicates that they may have diabetes and this should be confirmed with a follow-up test. .    BUN 8 7 - 25 mg/dL   Creat 0.69 0.50 - 1.10 mg/dL   GFR, Est Non African American 104 > OR = 60 mL/min/1.57m   GFR, Est African American 120 > OR = 60 mL/min/1.733m  BUN/Creatinine Ratio NOT APPLICABLE 6 - 22 (calc)   Sodium 134 (L) 135 - 146 mmol/L   Potassium 4.3 3.5 - 5.3 mmol/L   Chloride 100 98 - 110 mmol/L   CO2 26 20 - 32 mmol/L   Calcium 9.8 8.6  - 10.2 mg/dL   Total Protein 7.4 6.1 - 8.1 g/dL   Albumin 4.2 3.6 - 5.1 g/dL   Globulin 3.2 1.9 - 3.7 g/dL (calc)   AG Ratio 1.3 1.0 - 2.5 (calc)   Total Bilirubin 0.5 0.2 - 1.2 mg/dL   Alkaline phosphatase (APISO) 80 33 - 115 U/L   AST 12 10 - 35 U/L   ALT 11 6 - 29 U/L  Lipid panel     Status: Abnormal   Collection Time: 11/19/17  8:18 AM  Result Value Ref Range   Cholesterol 209 (H) <200 mg/dL   HDL 43 (L) >50 mg/dL   Triglycerides 75 <150 mg/dL   LDL Cholesterol (Calc) 148 (H) mg/dL (calc)    Comment: Reference range: <100 . Desirable range <100 mg/dL for primary prevention;   <70 mg/dL for patients with CHD or diabetic  patients  with > or = 2 CHD risk factors. Marland Kitchen LDL-C is now calculated using the Martin-Hopkins  calculation, which is a validated novel method providing  better accuracy than the Friedewald equation in the  estimation of LDL-C.  Cresenciano Genre et al. Annamaria Helling. 7616;073(71): 2061-2068  (http://education.[employer].com/faq/FAQ164)    Total CHOL/HDL Ratio 4.9 <5.0 (calc)   Non-HDL Cholesterol (Calc) 166 (H) <130 mg/dL (calc)    Comment: For patients with diabetes plus 1 major ASCVD risk  factor, treating to a non-HDL-C goal of <100 mg/dL  (LDL-C of <70 mg/dL) is considered a therapeutic  option.   TSH     Status: None   Collection Time: 11/19/17  8:18 AM  Result Value Ref Range   TSH 1.85 mIU/L    Comment:           Reference Range .           > or = 20 Years  0.40-4.50 .                Pregnancy Ranges           First trimester    0.26-2.66           Second trimester   0.55-2.73           Third trimester    0.43-2.91   Hemoglobin A1c     Status: Abnormal   Collection Time: 11/19/17  8:18 AM  Result Value Ref Range   Hgb A1c MFr Bld >14.0 (H) <5.7 % of total Hgb    Comment: Verified by repeat analysis. . For someone without known diabetes, a hemoglobin A1c value of 6.5% or greater indicates that they may have  diabetes and this should be confirmed  with a follow-up  test. . For someone with known diabetes, a value <7% indicates  that their diabetes is well controlled and a value  greater than or equal to 7% indicates suboptimal  control. A1c targets should be individualized based on  duration of diabetes, age, comorbid conditions, and  other considerations. . Currently, no consensus exists regarding use of hemoglobin A1c for diagnosis of diabetes for children. .    Mean Plasma Glucose  (calc)    Comment: eAG cannot be calculated. Hemoglobin A1c result exceeds the linearity of the assay.   Urinalysis, Routine w reflex microscopic     Status: Abnormal   Collection Time: 11/19/17  8:18 AM  Result Value Ref Range   Color, Urine YELLOW YELLOW   APPearance CLEAR CLEAR   Specific Gravity, Urine 1.035 1.001 - 1.03   pH < OR = 5.0 5.0 - 8.0   Glucose, UA 3+ (A) NEGATIVE   Bilirubin Urine NEGATIVE NEGATIVE   Ketones, ur NEGATIVE NEGATIVE   Hgb urine dipstick NEGATIVE NEGATIVE   Protein, ur NEGATIVE NEGATIVE   Nitrite NEGATIVE NEGATIVE   Leukocytes, UA NEGATIVE NEGATIVE      ASSESSMENT/PLAN:   Type 2 diabetes mellitus without complication, without long-term current use of insulin (HCC) - Plan: CBC, COMPLETE METABOLIC PANEL WITH GFR, Lipid panel, Hemoglobin A1c, Urinalysis, Routine w reflex microscopic  History of thyroid nodule - Plan: TSH  Encounter for surveillance of contraceptive pills  Numbness and tingling - Plan: Vitamin B12  Chronic nonintractable headache, unspecified headache type     Visit summary with medication list and pertinent instructions was printed for patient to review. All questions at time of visit were answered - patient instructed to contact office with any additional concerns. ER/RTC  precautions were reviewed with the patient.   Follow-up plan: Return for discuss lab results 1-2 weeks .   Please note: voice recognition software was used to produce this document, and typos may escape review.  Please contact Dr. Sheppard Coil for any needed clarifications.

## 2017-11-20 LAB — COMPLETE METABOLIC PANEL WITH GFR
AG Ratio: 1.3 (calc) (ref 1.0–2.5)
ALT: 11 U/L (ref 6–29)
AST: 12 U/L (ref 10–35)
Albumin: 4.2 g/dL (ref 3.6–5.1)
Alkaline phosphatase (APISO): 80 U/L (ref 33–115)
BUN: 8 mg/dL (ref 7–25)
CO2: 26 mmol/L (ref 20–32)
Calcium: 9.8 mg/dL (ref 8.6–10.2)
Chloride: 100 mmol/L (ref 98–110)
Creat: 0.69 mg/dL (ref 0.50–1.10)
GFR, EST NON AFRICAN AMERICAN: 104 mL/min/{1.73_m2} (ref >=60)
GFR, Est African American: 120 mL/min/{1.73_m2} (ref >=60)
GLOBULIN: 3.2 g/dL (ref 1.9–3.7)
Glucose, Bld: 345 mg/dL — ABNORMAL HIGH (ref 65–99)
Potassium: 4.3 mmol/L (ref 3.5–5.3)
SODIUM: 134 mmol/L — AB (ref 135–146)
Total Bilirubin: 0.5 mg/dL (ref 0.2–1.2)
Total Protein: 7.4 g/dL (ref 6.1–8.1)

## 2017-11-20 LAB — URINALYSIS, ROUTINE W REFLEX MICROSCOPIC
BILIRUBIN URINE: NEGATIVE
Hgb urine dipstick: NEGATIVE
Ketones, ur: NEGATIVE
Leukocytes, UA: NEGATIVE
Nitrite: NEGATIVE
PROTEIN: NEGATIVE
Specific Gravity, Urine: 1.035 (ref 1.001–1.03)
pH: <=5 (ref 5.0–8.0)

## 2017-11-20 LAB — LIPID PANEL
CHOLESTEROL: 209 mg/dL — AB (ref <200)
HDL: 43 mg/dL — AB (ref >50)
LDL Cholesterol (Calc): 148 mg/dL (calc) — ABNORMAL HIGH
NON-HDL CHOLESTEROL (CALC): 166 mg/dL — AB (ref <130)
TRIGLYCERIDES: 75 mg/dL (ref <150)
Total CHOL/HDL Ratio: 4.9 (calc) (ref <5.0)

## 2017-11-20 LAB — HEMOGLOBIN A1C: Hgb A1c MFr Bld: >14 % of total Hgb — ABNORMAL HIGH (ref <5.7)

## 2017-11-20 LAB — CBC
HEMATOCRIT: 38 % (ref 35.0–45.0)
Hemoglobin: 12.3 g/dL (ref 11.7–15.5)
MCH: 27.2 pg (ref 27.0–33.0)
MCHC: 32.4 g/dL (ref 32.0–36.0)
MCV: 84.1 fL (ref 80.0–100.0)
MPV: 12 fL (ref 7.5–12.5)
PLATELETS: 346 10*3/uL (ref 140–400)
RBC: 4.52 10*6/uL (ref 3.80–5.10)
RDW: 14.2 % (ref 11.0–15.0)
WBC: 4.8 10*3/uL (ref 3.8–10.8)

## 2017-11-20 LAB — TSH: TSH: 1.85 mIU/L

## 2017-11-21 DIAGNOSIS — R519 Headache, unspecified: Secondary | ICD-10-CM | POA: Insufficient documentation

## 2017-11-21 DIAGNOSIS — R87619 Unspecified abnormal cytological findings in specimens from cervix uteri: Secondary | ICD-10-CM | POA: Insufficient documentation

## 2017-11-21 DIAGNOSIS — D259 Leiomyoma of uterus, unspecified: Secondary | ICD-10-CM | POA: Insufficient documentation

## 2017-11-21 DIAGNOSIS — R51 Headache: Secondary | ICD-10-CM

## 2017-11-29 ENCOUNTER — Encounter: Payer: Self-pay | Admitting: Osteopathic Medicine

## 2017-11-29 ENCOUNTER — Ambulatory Visit (INDEPENDENT_AMBULATORY_CARE_PROVIDER_SITE_OTHER): Payer: BLUE CROSS/BLUE SHIELD | Admitting: Osteopathic Medicine

## 2017-11-29 VITALS — BP 128/80 | HR 79 | Temp 98.2°F | Wt 174.8 lb

## 2017-11-29 DIAGNOSIS — E1165 Type 2 diabetes mellitus with hyperglycemia: Secondary | ICD-10-CM

## 2017-11-29 MED ORDER — ERTUGLIFLOZIN-SITAGLIPTIN 5-100 MG PO TABS: 1.0000 | ORAL_TABLET | Freq: Every day | ORAL | 5 refills | Status: DC

## 2017-11-29 MED ORDER — METFORMIN HCL ER 500 MG PO TB24: 1000.0000 mg | ORAL_TABLET | Freq: Every day | ORAL | 5 refills | Status: DC

## 2017-11-29 NOTE — Patient Instructions (Addendum)
Diabetes preventive care:  Eye exam annually  Pneumonia shot is recommended  Certain other medications to reduce cardiac risk:   statin cholesterol medicine  ACE blood pressure medications  Aspirin in some people   Blood pressure goal is 130/80 or less - you're good there!   Cholesterol goal is LDL (bad cholesterol) of 70 or less - usually this requires medicine, we can recheck your levels in a few months  Routine labs at least once per year  A1C check every 3 months: goal is to get close to 7  Your homework:  Take the medicines! This is the easy part  Metformin, can start at 1 tablet daily and increase as tolerated. Some loose stool is expected, it shouldn't be severe or persistent  Norris Cross is a combination pill of two other medications. Try savings card for this, it should not cost anything! If it's expensive, please let me know and we can change this   Check fasting sugars at least a few times per week. I expect these will be high at first. Our goal is to eventually get these numbers close to 100.   Reduce carbohydrate diet  Increase exercise to help the body respond better to insulin  We may need insulin at some point, but let's give it our best shot on oral medications and diet/exercise changes, first!   Any questions, please let me know!!!

## 2017-11-29 NOTE — Progress Notes (Signed)
HPI: Haley Steele is a 47 y.o. female who  has a past medical history of Diabetes mellitus without complication ([place]).  she presents to [facility] today, 11/29/17,  for chief complaint of:  Lab results Diabetes discussion   DIABETES SCREENING/PREVENTIVE CARE: A1C past 3-6 mos: Yes  controlled? NO  11/18/17: >14 on no medications. Follow-up in the office today 11/29/17: started triple therapy w/ metformin and steglujan. She would like to avoid injectables at this point  BP goal <130/80: Yes  LDL goal <70: No  Eye exam annually: No , importance discussed with patient Foot exam: No  Microalbuminuria:need to check this Metformin: No - hx intolerance ACE/ARB: No - BP ok Antiplatelet if ASCVD Risk >10%: No  Statin: No  Pneumovax: No    Past medical history, surgical history, social history and family history reviewed. No updates needed.   Current medication list and allergy/intolerance information reviewed.    Current Outpatient Medications on File Prior to Visit  Medication Sig Dispense Refill  . norethindrone (HEATHER) 0.35 MG tablet Take 1 tablet by mouth daily.     No current facility-administered medications on file prior to visit.    No Known Allergies    Review of Systems:  Constitutional: No recent illness  Cardiac: No  chest pain,   Respiratory:  No  shortness of breath.  Musculoskeletal: No new myalgia/arthralgia  Skin: No  Rash   Exam:  BP 128/80 (BP Location: Right Arm, Patient Position: Sitting, Cuff Size: Normal)   Pulse 79   Temp 98.2 F (36.8 C) (Oral)   Wt 174 lb 12.8 oz (79.3 kg)   BMI 30.00 kg/m   Constitutional: VS see above. General Appearance: alert, well-developed, well-nourished, NAD  Eyes: Normal lids and conjunctive, non-icteric sclera  Ears, Nose, Mouth, Throat: MMM, Normal external inspection ears/nares/mouth/lips/gums.  Neck: No masses, trachea midline.   Respiratory: Normal  respiratory effort. no wheeze, no rhonchi, no rales  Cardiovascular: S1/S2 normal, no murmur, no rub/gallop auscultated. RRR.   Musculoskeletal: Gait normal. Symmetric and independent movement of all extremities  Neurological: Normal balance/coordination. No tremor.  Skin: warm, dry, intact.   Psychiatric: Normal judgment/insight. Normal mood and affect. Oriented x3.     ASSESSMENT/PLAN:   Type 2 diabetes mellitus with hyperglycemia, without long-term current use of insulin ([place]) - discussion on natural history of DM2, treatment strategies, preventive care    Meds ordered this encounter  Medications  . metFORMIN (GLUCOPHAGE XR) 500 MG 24 hr tablet    Sig: Take 2 tablets (1,000 mg total) by mouth daily with breakfast. (start with 1 tablet daily and increase as tolerated)    Dispense:  30 tablet    Refill:  5  . Ertugliflozin-SITagliptin (STEGLUJAN) 5-100 MG TABS    Sig: Take 1 tablet by mouth daily.    Dispense:  30 tablet    Refill:  5    PLEASE RUN SAVINGS CARD WITH THIS RX    Patient Instructions  Diabetes preventive care:  Eye exam annually  Pneumonia shot is recommended  Certain other medications to reduce cardiac risk:   statin cholesterol medicine  ACE blood pressure medications  Aspirin in some people   Blood pressure goal is 130/80 or less - you're good there!   Cholesterol goal is LDL (bad cholesterol) of 70 or less - usually this requires medicine, we can recheck your levels in a few months  Routine labs at least once per year  A1C check every 3 months:  goal is to get close to 7  Your homework:  Take the medicines! This is the easy part  Metformin, can start at 1 tablet daily and increase as tolerated. Some loose stool is expected, it shouldn't be severe or persistent  Norris Cross is a combination pill of two other medications. Try savings card for this, it should not cost anything! If it's expensive, please let me know and we can change this    Check fasting sugars at least a few times per week. I expect these will be high at first. Our goal is to eventually get these numbers close to 100.   Reduce carbohydrate diet  Increase exercise to help the body respond better to insulin  We may need insulin at some point, but let's give it our best shot on oral medications and diet/exercise changes, first!   Any questions, please let me know!!!      Follow-up plan: Return in about 6 weeks (around 01/10/2018) for recheck diabetes, review medications and sugars. See me sooner if needed .  Visit summary with medication list and pertinent instructions was printed for patient to review, alert [place] if any changes needed. All questions at time of visit were answered - patient instructed to contact office with any additional concerns. ER/RTC precautions were reviewed with the patient and understanding verbalized.   Note: Total time spent 40 minutes, greater than 50% of the visit was spent face-to-face counseling and coordinating care for the following: The encounter diagnosis was Type 2 diabetes mellitus with hyperglycemia, without long-term current use of insulin ([place])..  Please note: voice recognition software was used to produce this document, and typos may escape review. Please contact Dr. Sheppard Coil for any needed clarifications.

## 2017-12-01 ENCOUNTER — Encounter: Payer: Self-pay | Admitting: Osteopathic Medicine

## 2017-12-01 MED ORDER — EMPAGLIFLOZIN 25 MG PO TABS
25.0000 mg | ORAL_TABLET | Freq: Every day | ORAL | 3 refills | Status: DC
Start: 2017-12-01 — End: 2018-01-18

## 2017-12-01 MED ORDER — SITAGLIPTIN PHOSPHATE 100 MG PO TABS
100.0000 mg | ORAL_TABLET | Freq: Every day | ORAL | 3 refills | Status: DC
Start: 2017-12-01 — End: 2018-01-13

## 2017-12-16 ENCOUNTER — Telehealth: Payer: Self-pay

## 2017-12-16 MED ORDER — ATORVASTATIN CALCIUM 20 MG PO TABS: 20.0000 mg | ORAL_TABLET | Freq: Every day | ORAL | 3 refills | Status: DC

## 2017-12-16 NOTE — Telephone Encounter (Signed)
As per [facility]: "Pt was informed of diabetes care and we noticed your patient has not filled a stating therapy in the last 180 days. Pt would like to know if it is appropriate to start a statin therapy. If so, Pls send in a rx for statin". Pls advise, thanks.

## 2017-12-16 NOTE — Telephone Encounter (Signed)
Absolutely.  New prescription sent to pharmacy for generic atorvastatin.  When she is been on it for about 8 weeks I would like for her to come back and have a cholesterol and liver enzymes checked.  If everything looks good to go then we will just check it once a year.

## 2017-12-16 NOTE — Telephone Encounter (Signed)
Tried calling patient, phone keeps ringing, then disconnects. No option to leave vm msg. Will try again later today.

## 2017-12-24 DIAGNOSIS — Z1231 Encounter for screening mammogram for malignant neoplasm of breast: Secondary | ICD-10-CM | POA: Diagnosis not present

## 2018-01-11 ENCOUNTER — Ambulatory Visit: Payer: BLUE CROSS/BLUE SHIELD | Admitting: Osteopathic Medicine

## 2018-01-13 ENCOUNTER — Ambulatory Visit (INDEPENDENT_AMBULATORY_CARE_PROVIDER_SITE_OTHER): Payer: BLUE CROSS/BLUE SHIELD | Admitting: Osteopathic Medicine

## 2018-01-13 ENCOUNTER — Ambulatory Visit: Payer: BLUE CROSS/BLUE SHIELD | Admitting: Osteopathic Medicine

## 2018-01-13 ENCOUNTER — Encounter: Payer: Self-pay | Admitting: Osteopathic Medicine

## 2018-01-13 VITALS — BP 122/80 | HR 76 | Ht 64.02 in | Wt 181.0 lb

## 2018-01-13 DIAGNOSIS — E1165 Type 2 diabetes mellitus with hyperglycemia: Secondary | ICD-10-CM | POA: Diagnosis not present

## 2018-01-13 MED ORDER — LISINOPRIL 2.5 MG PO TABS: 2.5000 mg | ORAL_TABLET | Freq: Every day | ORAL | 1 refills | Status: DC

## 2018-01-13 MED ORDER — SITAGLIPTIN PHOSPHATE 50 MG PO TABS: 50.0000 mg | ORAL_TABLET | Freq: Every day | ORAL | 11 refills | Status: DC

## 2018-01-13 MED ORDER — BLOOD GLUCOSE MONITOR KIT: PACK | 1 refills | Status: AC

## 2018-01-13 NOTE — Progress Notes (Signed)
HPI: Haley Steele is a 47 y.o. female who  has a past medical history of Diabetes mellitus without complication ([place]).  she presents to [facility] today, 01/13/18,  for chief complaint of:  Lab results Diabetes discussion   DIABETES SCREENING/PREVENTIVE CARE: A1C past 3-6 mos: Yes  controlled? NO  11/18/17: >14 on no medications. Follow-up in the office 11/29/17: started triple therapy w/ metformin and steglujan. She would like to avoid injectables at this point. Steglujan not covered - we are doing empagliflozin 25 daily, Metformin XR 1000 mg daily, sitagliptin 50 mg daily    01/13/18: following up but she hasn't been checking home Glc, she states needs a new glucometer. The goal of this visit was to go over home Glc since not due for A1C repeat but I wanted to follow up closely.  She's tolerating meds well on triple therapy: metformin, januvia, jardiance. Is still on metformin XR 500 mg only, hasn't gone up to 1000.  BP goal <130/80: Yes  LDL goal <70: No  Eye exam annually: No , importance discussed with patient Foot exam: No  Microalbuminuria: need to check  Metformin: doing well on this ACE/ARB: started today  Antiplatelet if ASCVD Risk >10%: No  Statin: Yes Pneumovax: No     Past medical history, surgical history, social history and family history reviewed. No updates needed.   Current medication list and allergy/intolerance information reviewed.    Current Outpatient Medications on File Prior to Visit  Medication Sig Dispense Refill  . atorvastatin (LIPITOR) 20 MG tablet Take 1 tablet (20 mg total) by mouth daily. 90 tablet 3  . empagliflozin (JARDIANCE) 25 MG TABS tablet Take 25 mg by mouth daily. 30 tablet 3  . Ertugliflozin-SITagliptin (STEGLUJAN) 5-100 MG TABS Take 1 tablet by mouth daily. 30 tablet 5  . metFORMIN (GLUCOPHAGE XR) 500 MG 24 hr tablet Take 2 tablets (1,000 mg total) by mouth daily with breakfast. (start with  1 tablet daily and increase as tolerated) 30 tablet 5  . norethindrone (HEATHER) 0.35 MG tablet Take 1 tablet by mouth daily.    . sitaGLIPtin (JANUVIA) 100 MG tablet Take 1 tablet (100 mg total) by mouth daily. 30 tablet 3   No current facility-administered medications on file prior to visit.    No Known Allergies    Review of Systems:  Constitutional: No illness, no fever/chills  Cardiac: No chest pain/SOB, no LE swelling  Respiratory:  No SOB/cough  Musculoskeletal: No new myalgia/arthralgia  Skin: No  Rash   Exam:  BP 122/80   Pulse 76   Ht 5' 4.02" (1.626 m)   Wt 181 lb (82.1 kg)   SpO2 97%   BMI 31.05 kg/m   Constitutional: VS see above. General Appearance: alert, well-developed, well-nourished, NAD  Eyes: Normal lids and conjunctive, non-icteric sclera  Ears, Nose, Mouth, Throat: MMM, Normal external inspection ears/nares/mouth/lips/gums.  Neck: No masses, trachea midline.   Respiratory: Normal respiratory effort.  Musculoskeletal: Gait normal. Symmetric and independent movement of all extremities  Neurological: Normal balance/coordination. No tremor.  Skin: warm, dry, intact.   Psychiatric: Normal judgment/insight. Normal mood and affect. Oriented x3.     ASSESSMENT/PLAN:   Type 2 diabetes mellitus with hyperglycemia, without long-term current use of insulin ([place]) - Plan: COMPLETE METABOLIC PANEL WITH GFR, Lipid panel   Started ACE for renal protection  Ideally would like to review blood glucose levels but patient does not have these available.  Prescription written for new glucometer/supplies.  Meds ordered this encounter  Medications  . blood glucose meter kit and supplies KIT    Sig: Dispense based on patient and insurance preference. Use up to four times daily as directed. Please include 100 lancets, 100 test strips, control solution - all to refill x99 as needed    Dispense:  1 each    Refill:  1    Order Specific Question:   Number of  strips    Answer:   100    Order Specific Question:   Number of lancets    Answer:   100  .               . lisinopril (PRINIVIL,ZESTRIL) 2.5 MG tablet    Sig: Take 1 tablet (2.5 mg total) by mouth daily.    Dispense:  90 tablet    Refill:  1      Follow-up plan: Return for recheck A1C around or after 02/18/18, sooner if needed .  Visit summary with medication list and pertinent instructions was printed for patient to review, alert [place] if any changes needed. All questions at time of visit were answered - patient instructed to contact office with any additional concerns. ER/RTC precautions were reviewed with the patient and understanding verbalized.   Note: Total time spent 25 minutes, greater than 50% of the visit was spent face-to-face counseling and coordinating care for the following: The encounter diagnosis was Type 2 diabetes mellitus with hyperglycemia, without long-term current use of insulin ([place])..  Please note: voice recognition software was used to produce this document, and typos may escape review. Please contact Dr. Sheppard Coil for any needed clarifications.

## 2018-01-14 ENCOUNTER — Encounter: Payer: Self-pay | Admitting: Osteopathic Medicine

## 2018-01-14 NOTE — Telephone Encounter (Signed)
Pt reviewed active medication on 01/13/18 visit with provider.

## 2018-01-18 ENCOUNTER — Other Ambulatory Visit: Payer: Self-pay | Admitting: Osteopathic Medicine

## 2018-01-18 NOTE — Telephone Encounter (Signed)
I think another CMA was working with me that day, she may not have canceled the order I sent accidentally.  Patient should be taking the following:  For sugars:  Metformin XR, 500 mg, 2 tablets daily Jardiance 25 mg daily - also known as empagliflozin Januvia 50 mg daily - also known as sitagliptin (I think I accidentally sent a refill of this at her last visit 01/13/18)  Other medications: Lisinopril 2.5 mg daily Atorvastatin/Lipitor 20 mg daily

## 2018-01-18 NOTE — Telephone Encounter (Signed)
Pt/Pharmacy has been updated.

## 2018-01-18 NOTE — Telephone Encounter (Signed)
Pt called stating she is confused on what medications she is suppose to be taking. She was given a "new med" from pharmacy, but could not tell me what it was.  Requesting clarification from provider what should be her current active med list. Additionally pharmacy has sent med RF request for Jardiance. Pls advise, thanks.

## 2018-01-31 LAB — COMPLETE METABOLIC PANEL WITH GFR
AG RATIO: 1.3 (calc) (ref 1.0–2.5)
ALT: 6 U/L (ref 6–29)
AST: 10 U/L (ref 10–35)
Albumin: 4.2 g/dL (ref 3.6–5.1)
Alkaline phosphatase (APISO): 77 U/L (ref 33–115)
BILIRUBIN TOTAL: 0.3 mg/dL (ref 0.2–1.2)
BUN: 12 mg/dL (ref 7–25)
CHLORIDE: 105 mmol/L (ref 98–110)
CO2: 25 mmol/L (ref 20–32)
Calcium: 9.6 mg/dL (ref 8.6–10.2)
Creat: 0.68 mg/dL (ref 0.50–1.10)
GFR, EST AFRICAN AMERICAN: 121 mL/min/{1.73_m2} (ref >=60)
GFR, Est Non African American: 104 mL/min/{1.73_m2} (ref >=60)
GLOBULIN: 3.3 g/dL (ref 1.9–3.7)
GLUCOSE: 103 mg/dL — AB (ref 65–99)
POTASSIUM: 4.1 mmol/L (ref 3.5–5.3)
SODIUM: 138 mmol/L (ref 135–146)
Total Protein: 7.5 g/dL (ref 6.1–8.1)

## 2018-01-31 LAB — LIPID PANEL
Cholesterol: 175 mg/dL (ref <200)
HDL: 48 mg/dL — ABNORMAL LOW (ref >50)
LDL Cholesterol (Calc): 113 mg/dL (calc) — ABNORMAL HIGH
NON-HDL CHOLESTEROL (CALC): 127 mg/dL (ref <130)
Total CHOL/HDL Ratio: 3.6 (calc) (ref <5.0)
Triglycerides: 47 mg/dL (ref <150)

## 2018-02-01 ENCOUNTER — Other Ambulatory Visit: Payer: Self-pay | Admitting: Osteopathic Medicine

## 2018-02-01 MED ORDER — ATORVASTATIN CALCIUM 40 MG PO TABS: 40.0000 mg | ORAL_TABLET | Freq: Every day | ORAL | 1 refills | Status: DC

## 2018-02-01 NOTE — Progress Notes (Signed)
Increase statin dose 20 to 40 to help reach target LDL

## 2018-02-18 ENCOUNTER — Ambulatory Visit: Payer: BLUE CROSS/BLUE SHIELD | Admitting: Osteopathic Medicine

## 2018-02-18 DIAGNOSIS — Z1389 Encounter for screening for other disorder: Secondary | ICD-10-CM | POA: Diagnosis not present

## 2018-02-18 DIAGNOSIS — Z130 Encounter for screening for diseases of the blood and blood-forming organs and certain disorders involving the immune mechanism: Secondary | ICD-10-CM | POA: Diagnosis not present

## 2018-02-18 DIAGNOSIS — Z01419 Encounter for gynecological examination (general) (routine) without abnormal findings: Secondary | ICD-10-CM | POA: Diagnosis not present

## 2018-02-18 DIAGNOSIS — Z124 Encounter for screening for malignant neoplasm of cervix: Secondary | ICD-10-CM | POA: Diagnosis not present

## 2018-02-22 ENCOUNTER — Encounter: Payer: Self-pay | Admitting: Osteopathic Medicine

## 2018-07-04 ENCOUNTER — Other Ambulatory Visit: Payer: Self-pay | Admitting: Osteopathic Medicine

## 2018-07-23 ENCOUNTER — Other Ambulatory Visit: Payer: Self-pay | Admitting: Osteopathic Medicine

## 2018-09-16 ENCOUNTER — Other Ambulatory Visit: Payer: Self-pay | Admitting: Osteopathic Medicine

## 2018-10-18 ENCOUNTER — Other Ambulatory Visit: Payer: Self-pay

## 2018-10-18 MED ORDER — METFORMIN HCL ER 500 MG PO TB24: 1000.0000 mg | ORAL_TABLET | Freq: Every day | ORAL | 0 refills | Status: DC

## 2018-10-26 ENCOUNTER — Other Ambulatory Visit: Payer: Self-pay | Admitting: Osteopathic Medicine

## 2018-11-04 ENCOUNTER — Other Ambulatory Visit: Payer: Self-pay | Admitting: Osteopathic Medicine

## 2018-11-26 ENCOUNTER — Other Ambulatory Visit: Payer: Self-pay | Admitting: Osteopathic Medicine

## 2018-12-24 ENCOUNTER — Other Ambulatory Visit: Payer: Self-pay | Admitting: Osteopathic Medicine

## 2018-12-24 DIAGNOSIS — E1165 Type 2 diabetes mellitus with hyperglycemia: Secondary | ICD-10-CM

## 2018-12-26 NOTE — Telephone Encounter (Signed)
Left a vm msg for pt regarding med refill sent to pharmacy. Aware she is overdue for labs. Pls place order for pt. Thanks.

## 2018-12-26 NOTE — Telephone Encounter (Signed)
[facility] requesting med refill for atorvastatin. Last cholesterol check was 01/31/18, abnl results. As per provider's note, wanted to recheck cholesterol. Pls advise, thanks.

## 2018-12-27 ENCOUNTER — Other Ambulatory Visit: Payer: Self-pay | Admitting: Osteopathic Medicine

## 2018-12-27 NOTE — Telephone Encounter (Signed)
Left message with information below and for patient to call us back to schedule an appointment or webex. °

## 2018-12-27 NOTE — Telephone Encounter (Signed)
She actually is way overdue for diabetes follow-up Can we have her schedule appt? Virtual is ok if she can get labs ahead of time. Orders are in.

## 2018-12-27 NOTE — Addendum Note (Signed)
Addended by: Maryla Morrow on: 12/27/2018 07:58 AM   Modules accepted: Orders

## 2018-12-27 NOTE — Telephone Encounter (Signed)
Please schedule Patient, thank you.

## 2019-01-26 DIAGNOSIS — Z1231 Encounter for screening mammogram for malignant neoplasm of breast: Secondary | ICD-10-CM | POA: Diagnosis not present

## 2019-02-17 ENCOUNTER — Other Ambulatory Visit: Payer: Self-pay | Admitting: Osteopathic Medicine

## 2019-02-17 NOTE — Telephone Encounter (Signed)
Please advise 

## 2019-02-22 DIAGNOSIS — Z130 Encounter for screening for diseases of the blood and blood-forming organs and certain disorders involving the immune mechanism: Secondary | ICD-10-CM | POA: Diagnosis not present

## 2019-02-22 DIAGNOSIS — Z1389 Encounter for screening for other disorder: Secondary | ICD-10-CM | POA: Diagnosis not present

## 2019-02-22 DIAGNOSIS — D259 Leiomyoma of uterus, unspecified: Secondary | ICD-10-CM | POA: Diagnosis not present

## 2019-02-22 DIAGNOSIS — Z01419 Encounter for gynecological examination (general) (routine) without abnormal findings: Secondary | ICD-10-CM | POA: Diagnosis not present

## 2019-02-23 DIAGNOSIS — Z124 Encounter for screening for malignant neoplasm of cervix: Secondary | ICD-10-CM | POA: Diagnosis not present

## 2019-03-16 ENCOUNTER — Telehealth: Payer: Self-pay | Admitting: Osteopathic Medicine

## 2019-03-16 MED ORDER — GLUCOSE BLOOD VI STRP: ORAL_STRIP | 12 refills | Status: DC

## 2019-03-16 NOTE — Telephone Encounter (Signed)
-----   Message from Willy Eddy, RN sent at 03/14/2019  3:20 PM EDT ----- Regarding: Epic Error Good afternoon Dr. Sheppard Coil,   Do to an issue with Epic the refill order did not go through or was auto cancelled.  We are working with Epic and hope to have this issue resolved by 03/30/19.  We apologize for any inconvenience this may cause.  Please place a new order for the medication.  02/20/2019 ACCU-CHEK AVIVA PLUS VI STRP  Thank you,  Zebedee Iba, RN, BSN Ambulatory Analyst

## 2019-03-22 ENCOUNTER — Other Ambulatory Visit: Payer: Self-pay | Admitting: Osteopathic Medicine

## 2019-03-22 MED ORDER — GLUCOSE BLOOD VI STRP: 1.0000 | ORAL_STRIP | 99 refills | Status: DC | PRN

## 2019-06-12 DIAGNOSIS — L720 Epidermal cyst: Secondary | ICD-10-CM | POA: Diagnosis not present

## 2019-07-11 ENCOUNTER — Other Ambulatory Visit: Payer: Self-pay

## 2019-07-11 DIAGNOSIS — Z20822 Contact with and (suspected) exposure to covid-19: Secondary | ICD-10-CM

## 2019-07-14 LAB — NOVEL CORONAVIRUS, NAA: SARS-CoV-2, NAA: NOT DETECTED

## 2019-08-14 ENCOUNTER — Other Ambulatory Visit: Payer: Self-pay | Admitting: Osteopathic Medicine

## 2019-10-11 ENCOUNTER — Ambulatory Visit (INDEPENDENT_AMBULATORY_CARE_PROVIDER_SITE_OTHER): Payer: BC Managed Care – PPO | Admitting: Osteopathic Medicine

## 2019-10-11 ENCOUNTER — Other Ambulatory Visit: Payer: Self-pay

## 2019-10-11 ENCOUNTER — Encounter: Payer: Self-pay | Admitting: Osteopathic Medicine

## 2019-10-11 VITALS — BP 114/73 | HR 77 | Temp 97.7°F | Wt 181.0 lb

## 2019-10-11 DIAGNOSIS — E1165 Type 2 diabetes mellitus with hyperglycemia: Secondary | ICD-10-CM | POA: Diagnosis not present

## 2019-10-11 LAB — POCT GLYCOSYLATED HEMOGLOBIN (HGB A1C): HbA1c POC (<> result, manual entry): >14 % (ref 4.0–5.6)

## 2019-10-11 MED ORDER — METFORMIN HCL ER 500 MG PO TB24: 1000.0000 mg | ORAL_TABLET | Freq: Every day | ORAL | 0 refills | Status: DC

## 2019-10-11 MED ORDER — LISINOPRIL 2.5 MG PO TABS: 2.5000 mg | ORAL_TABLET | Freq: Every day | ORAL | 0 refills | Status: DC

## 2019-10-11 MED ORDER — ATORVASTATIN CALCIUM 40 MG PO TABS: 40.0000 mg | ORAL_TABLET | Freq: Every day | ORAL | 0 refills | Status: DC

## 2019-10-11 MED ORDER — SITAGLIPTIN PHOSPHATE 50 MG PO TABS: 50.0000 mg | ORAL_TABLET | Freq: Every day | ORAL | 0 refills | Status: DC

## 2019-10-11 MED ORDER — JARDIANCE 25 MG PO TABS: 25.0000 mg | ORAL_TABLET | Freq: Every day | ORAL | 0 refills | Status: DC

## 2019-10-11 NOTE — Progress Notes (Signed)
Haley Steele is a 49 y.o. female who presents to  [place] [facility]  today, 10/11/19, seeking care for the following: . DM2     ASSESSMENT & PLAN with other pertinent history/findings:  The encounter diagnosis was Type 2 diabetes mellitus with hyperglycemia, without long-term current use of insulin ([place]).  Discussed importance of medication adherence and routine follow-up.  Will refill meds but I admit I do not have high hopes that po meds and lifestyle changes will get A1C to goal and I adviesd patient I would have a low threshold for starting insulin - we will see!    DIABETES SCREENING/PREVENTIVE CARE: A1C past 3-6 mos: Yes  controlled? NO  11/18/17: >14 on no medications.   Follow-up in the office 11/29/17: started triple therapy w/ metformin and steglujan. She would like to avoid injectables at this point. Steglujan not covered - we are doing empagliflozin 25 daily, Metformin XR 1000 mg daily, sitagliptin 50 mg daily    Last visit 01/13/18: following up but she hasn't been checking home Glc, she states needs a new glucometer. The goal of this visit was to go over home Glc since not due for A1C repeat but I wanted to follow up closely.  She's tolerating meds well on triple therapy: metformin, januvia, jardiance. Is still on metformin XR 500 mg only, hasn't gone up to 1000.   Today 10/11/19: >14 again, pt reports not taking Rx consistently.  BP goal <130/80: Yes  BP Readings from Last 3 Encounters:  10/11/19 114/73  01/13/18 122/80  11/29/17 128/80   LDL goal <70: No  Eye exam annually: No , importance discussed with patient Foot exam: No  Microalbuminuria: need to check  Metformin: needs ACE/ARB: needs  Antiplatelet if ASCVD Risk >10%: probably will need  Statin: needs Pneumovax: needs   There are no Patient Instructions on file for this visit.   Orders Placed This Encounter  Procedures  . CBC  .  COMPLETE METABOLIC PANEL WITH GFR  . Lipid panel  . TSH  . Microalbumin / creatinine urine ratio  . POCT HgB A1C        Follow-up instructions: Return in about 4 weeks (around 11/08/2019) for VIRTUAL VISIT go over sugars. OFFICE VISIT in 3 months (around 01/11/20) to recheck A1C .                                         BP 114/73 (BP Location: Right Arm, Patient Position: Sitting, Cuff Size: Normal)   Pulse 77   Temp 97.7 F (36.5 C) (Oral)   Wt 181 lb 0.6 oz (82.1 kg)   BMI 31.06 kg/m   Current Meds  Medication Sig  . atorvastatin (LIPITOR) 40 MG tablet Take 1 tablet (40 mg total) by mouth daily.  . blood glucose meter kit and supplies KIT Dispense based on patient and insurance preference. Use up to four times daily as directed. Please include 100 lancets, 100 test strips, control solution - all to refill x99 as needed  . empagliflozin (JARDIANCE) 25 MG TABS tablet Take 25 mg by mouth daily.  Marland Kitchen glucose blood test strip 1 each by Other route as needed for other (up to qid). Use as instructed  . lisinopril (ZESTRIL) 2.5 MG tablet Take 1 tablet (2.5 mg total) by mouth daily.  . metFORMIN (GLUCOPHAGE-XR) 500 MG 24 hr  tablet Take 2 tablets (1,000 mg total) by mouth daily.  . norethindrone (HEATHER) 0.35 MG tablet Take 1 tablet by mouth daily.  . sitaGLIPtin (JANUVIA) 50 MG tablet Take 1 tablet (50 mg total) by mouth daily.  . [DISCONTINUED] atorvastatin (LIPITOR) 40 MG tablet TAKE 1 TABLET (40 MG TOTAL) BY MOUTH DAILY. DUE FOR LABS IN APRIL  . [DISCONTINUED] empagliflozin (JARDIANCE) 25 MG TABS tablet Take 25 mg by mouth daily. Due for follow up visit w/PCP  . [DISCONTINUED] lisinopril (PRINIVIL,ZESTRIL) 2.5 MG tablet TAKE 1 TABLET BY MOUTH EVERY DAY  . [DISCONTINUED] metFORMIN (GLUCOPHAGE-XR) 500 MG 24 hr tablet Take 2 tablets (1,000 mg total) by mouth daily. Due for follow up visit w/PCP  . [DISCONTINUED] sitaGLIPtin (JANUVIA) 50 MG tablet Take 1  tablet (50 mg total) by mouth daily.    Results for orders placed or performed in visit on 10/11/19 (from the past 72 hour(s))  POCT HgB A1C     Status: None   Collection Time: 10/11/19  9:48 AM  Result Value Ref Range   Hemoglobin A1C     HbA1c POC (<> result, manual entry) >14.0 4.0 - 5.6 %   HbA1c, POC (prediabetic range)     HbA1c, POC (controlled diabetic range)    CBC     Status: None   Collection Time: 10/11/19 10:00 AM  Result Value Ref Range   WBC 4.6 3.8 - 10.8 Thousand/uL   RBC 4.59 3.80 - 5.10 Million/uL   Hemoglobin 13.3 11.7 - 15.5 g/dL   HCT 40.7 35.0 - 45.0 %   MCV 88.7 80.0 - 100.0 fL   MCH 29.0 27.0 - 33.0 pg   MCHC 32.7 32.0 - 36.0 g/dL   RDW 12.7 11.0 - 15.0 %   Platelets 244 140 - 400 Thousand/uL   MPV 12.3 7.5 - 12.5 fL  COMPLETE METABOLIC PANEL WITH GFR     Status: Abnormal   Collection Time: 10/11/19 10:00 AM  Result Value Ref Range   Glucose, Bld 343 (H) 65 - 99 mg/dL    Comment: .            Fasting reference interval . For someone without known diabetes, a glucose value >125 mg/dL indicates that they may have diabetes and this should be confirmed with a follow-up test. .    BUN 12 7 - 25 mg/dL   Creat 0.69 0.50 - 1.10 mg/dL   GFR, Est Non African American 103 > OR = 60 mL/min/1.53m   GFR, Est African American 119 > OR = 60 mL/min/1.761m  BUN/Creatinine Ratio NOT APPLICABLE 6 - 22 (calc)   Sodium 137 135 - 146 mmol/L   Potassium 5.0 3.5 - 5.3 mmol/L   Chloride 102 98 - 110 mmol/L   CO2 25 20 - 32 mmol/L   Calcium 10.0 8.6 - 10.2 mg/dL   Total Protein 7.5 6.1 - 8.1 g/dL   Albumin 4.2 3.6 - 5.1 g/dL   Globulin 3.3 1.9 - 3.7 g/dL (calc)   AG Ratio 1.3 1.0 - 2.5 (calc)   Total Bilirubin 0.3 0.2 - 1.2 mg/dL   Alkaline phosphatase (APISO) 86 31 - 125 U/L   AST 14 10 - 35 U/L   ALT 10 6 - 29 U/L  Lipid panel     Status: Abnormal   Collection Time: 10/11/19 10:00 AM  Result Value Ref Range   Cholesterol 202 (H) <200 mg/dL   HDL 41 (L) >  OR = 50 mg/dL  Triglycerides 89 <150 mg/dL   LDL Cholesterol (Calc) 142 (H) mg/dL (calc)    Comment: Reference range: <100 . Desirable range <100 mg/dL for primary prevention;   <70 mg/dL for patients with CHD or diabetic patients  with > or = 2 CHD risk factors. Marland Kitchen LDL-C is now calculated using the Martin-Hopkins  calculation, which is a validated novel method providing  better accuracy than the Friedewald equation in the  estimation of LDL-C.  Cresenciano Genre et al. Annamaria Helling. 2778;242(35): 2061-2068  (http://education.[employer].com/faq/FAQ164)    Total CHOL/HDL Ratio 4.9 <5.0 (calc)   Non-HDL Cholesterol (Calc) 161 (H) <130 mg/dL (calc)    Comment: For patients with diabetes plus 1 major ASCVD risk  factor, treating to a non-HDL-C goal of <100 mg/dL  (LDL-C of <70 mg/dL) is considered a therapeutic  option.     No results found.  Depression screen [facility] 2/9 11/18/2017 03/23/2017 08/19/2015  Decreased Interest 1 0 0  Down, Depressed, Hopeless 1 0 0  PHQ - 2 Score 2 0 0  Altered sleeping 2 - -  Tired, decreased energy 2 - -  Change in appetite 2 - -  Feeling bad or failure about yourself  0 - -  Trouble concentrating 1 - -  Moving slowly or fidgety/restless 0 - -  Suicidal thoughts 0 - -  PHQ-9 Score 9 - -  Difficult doing work/chores Not difficult at all - -    GAD 7 : Generalized Anxiety Score 11/18/2017  Nervous, Anxious, on Edge 0  Control/stop worrying 1  Worry too much - different things 1  Trouble relaxing 1  Restless 0  Easily annoyed or irritable 0  Afraid - awful might happen 0  Total GAD 7 Score 3  Anxiety Difficulty Not difficult at all      All questions at time of visit were answered - patient instructed to contact office with any additional concerns or updates.  ER/RTC precautions were reviewed with the patient.  Please note: voice recognition software was used to produce this document, and typos may escape review. Please contact Dr. Sheppard Coil for any  needed clarifications.

## 2019-10-12 LAB — LIPID PANEL
Cholesterol: 202 mg/dL — ABNORMAL HIGH (ref <200)
HDL: 41 mg/dL — ABNORMAL LOW (ref >=50)
LDL Cholesterol (Calc): 142 mg/dL (calc) — ABNORMAL HIGH
Non-HDL Cholesterol (Calc): 161 mg/dL (calc) — ABNORMAL HIGH (ref <130)
Total CHOL/HDL Ratio: 4.9 (calc) (ref <5.0)
Triglycerides: 89 mg/dL (ref <150)

## 2019-10-12 LAB — TSH: TSH: 2.4 mIU/L

## 2019-10-12 LAB — CBC
HCT: 40.7 % (ref 35.0–45.0)
Hemoglobin: 13.3 g/dL (ref 11.7–15.5)
MCH: 29 pg (ref 27.0–33.0)
MCHC: 32.7 g/dL (ref 32.0–36.0)
MCV: 88.7 fL (ref 80.0–100.0)
MPV: 12.3 fL (ref 7.5–12.5)
Platelets: 244 10*3/uL (ref 140–400)
RBC: 4.59 10*6/uL (ref 3.80–5.10)
RDW: 12.7 % (ref 11.0–15.0)
WBC: 4.6 10*3/uL (ref 3.8–10.8)

## 2019-10-12 LAB — COMPLETE METABOLIC PANEL WITH GFR
AG Ratio: 1.3 (calc) (ref 1.0–2.5)
ALT: 10 U/L (ref 6–29)
AST: 14 U/L (ref 10–35)
Albumin: 4.2 g/dL (ref 3.6–5.1)
Alkaline phosphatase (APISO): 86 U/L (ref 31–125)
BUN: 12 mg/dL (ref 7–25)
CO2: 25 mmol/L (ref 20–32)
Calcium: 10 mg/dL (ref 8.6–10.2)
Chloride: 102 mmol/L (ref 98–110)
Creat: 0.69 mg/dL (ref 0.50–1.10)
GFR, Est African American: 119 mL/min/{1.73_m2} (ref >=60)
GFR, Est Non African American: 103 mL/min/{1.73_m2} (ref >=60)
Globulin: 3.3 g/dL (calc) (ref 1.9–3.7)
Glucose, Bld: 343 mg/dL — ABNORMAL HIGH (ref 65–99)
Potassium: 5 mmol/L (ref 3.5–5.3)
Sodium: 137 mmol/L (ref 135–146)
Total Bilirubin: 0.3 mg/dL (ref 0.2–1.2)
Total Protein: 7.5 g/dL (ref 6.1–8.1)

## 2019-10-12 LAB — MICROALBUMIN / CREATININE URINE RATIO
Creatinine, Urine: 102 mg/dL (ref 20–275)
Microalb Creat Ratio: 27 ug/mg{creat}
Microalb, Ur: 2.8 mg/dL

## 2019-10-16 DIAGNOSIS — D259 Leiomyoma of uterus, unspecified: Secondary | ICD-10-CM | POA: Diagnosis not present

## 2019-10-16 DIAGNOSIS — N763 Subacute and chronic vulvitis: Secondary | ICD-10-CM | POA: Diagnosis not present

## 2019-11-03 DIAGNOSIS — Z23 Encounter for immunization: Secondary | ICD-10-CM | POA: Diagnosis not present

## 2019-11-08 ENCOUNTER — Encounter: Payer: Self-pay | Admitting: Osteopathic Medicine

## 2019-11-08 ENCOUNTER — Telehealth (INDEPENDENT_AMBULATORY_CARE_PROVIDER_SITE_OTHER): Payer: BC Managed Care – PPO | Admitting: Osteopathic Medicine

## 2019-11-08 VITALS — Temp 97.3°F | Wt 181.0 lb

## 2019-11-08 DIAGNOSIS — E1165 Type 2 diabetes mellitus with hyperglycemia: Secondary | ICD-10-CM | POA: Diagnosis not present

## 2019-11-08 NOTE — Progress Notes (Signed)
Virtual Visit via Video (App used: MyChart) Note  I connected with      Haley Steele on 11/08/19 at 9:50 AM  by a telemedicine application and verified that I am speaking with the correct person using two identifiers.  Patient is at work  I am in office   I discussed the limitations of evaluation and management by telemedicine and the availability of in person appointments. The patient expressed understanding and agreed to proceed.  History of Present Illness: Haley Steele is a 49 y.o. female who would like to discuss follow-up sugars    DIABETES SCREENING/PREVENTIVE CARE:  A1C past 3-6 mos: Yes controlled? NO  11/18/17: >14 on no medications.   Follow-up in the office 11/29/17: started triple therapy w/ metformin and steglujan. She would like to avoid injectables at this point. Steglujan not covered - we are doing empagliflozin 25 daily, Metformin XR 1000 mg daily, sitagliptin 50 mg daily  01/13/18: following up but she hasn't been checking home Glc, she states needs a new glucometer. The goal of this visit was to go over home Glc since not due for A1C repeat but I wanted to follow up closely. She's tolerating meds well on triple therapy: metformin, januvia, jardiance. Is still on metformin XR 500 mg only, hasn't gone up to 1000.   10/11/19: >14 again, pt reports not taking Rx consistently --> Discussed importance of medication adherence and routine follow-up. Will refill meds and closely monitor   Today 11/08/19 pt reports Glc 149 130 fasting levels in AM --> will wait and see how A1C is looking! No side effects from Rx.    BP goal <130/80: Yes     BP Readings from Last 3 Encounters:  10/11/19 114/73  01/13/18 122/80  11/29/17 128/80   LDL goal <70: No, was 142 10/11/2019 Eye exam annually: No , importance discussed with patient Foot exam: No  Microalbuminuria: was negative, pt started on ACE either way for prevention  Metformin: restarted   ACE/ARB:Lisinopril 2.5 mg Antiplatelet if ASCVD Risk >10%: probably will need  Statin: Atrovastatin 40 mg daily  Pneumovax: needs     Observations/Objective: Temp (!) 97.3 F (36.3 C)   Wt 181 lb (82.1 kg)   BMI 31.05 kg/m  BP Readings from Last 3 Encounters:  10/11/19 114/73  01/13/18 122/80  11/29/17 128/80   Exam: Normal Speech.  NAD  Lab and Radiology Results No results found for this or any previous visit (from the past 72 hour(s)). No results found.     Assessment and Plan: 49 y.o. female with The encounter diagnosis was Type 2 diabetes mellitus with hyperglycemia, without long-term current use of insulin ([place]).  Fasting Glc not too bad Will continue current meds A1C and repeat labs in 2 mos   PDMP not reviewed this encounter. No orders of the defined types were placed in this encounter.  No orders of the defined types were placed in this encounter.  There are no Patient Instructions on file for this visit.  Instructions sent via MyChart. If MyChart not available, pt was given option for info via personal e-mail w/ no guarantee of protected health info over unsecured e-mail communication, and MyChart sign-up instructions were sent to patient.   Follow Up Instructions: No follow-ups on file.    I discussed the assessment and treatment plan with the patient. The patient was provided an opportunity to ask questions and all were answered. The patient agreed with the plan and demonstrated an understanding of  the instructions.   The patient was advised to call back or seek an in-person evaluation if any new concerns, if symptoms worsen or if the condition fails to improve as anticipated.  15 minutes of non-face-to-face time was provided during this encounter.      . . . . . . . . . . . . . Marland Kitchen                   Historical information moved to improve visibility of documentation.  Past Medical History:  Diagnosis Date  .  Diabetes mellitus without complication ([place])    No past surgical history on file. Social History   Tobacco Use  . Smoking status: Never Smoker  . Smokeless tobacco: Never Used  Substance Use Topics  . Alcohol use: Yes    Alcohol/week: 0.0 standard drinks   family history is not on file.  Medications: Current Outpatient Medications  Medication Sig Dispense Refill  . atorvastatin (LIPITOR) 40 MG tablet Take 1 tablet (40 mg total) by mouth daily. 90 tablet 0  . blood glucose meter kit and supplies KIT Dispense based on patient and insurance preference. Use up to four times daily as directed. Please include 100 lancets, 100 test strips, control solution - all to refill x99 as needed 1 each 1  . empagliflozin (JARDIANCE) 25 MG TABS tablet Take 25 mg by mouth daily. 90 tablet 0  . glucose blood test strip 1 each by Other route as needed for other (up to qid). Use as instructed 100 each 99  . lisinopril (ZESTRIL) 2.5 MG tablet Take 1 tablet (2.5 mg total) by mouth daily. 90 tablet 0  . metFORMIN (GLUCOPHAGE-XR) 500 MG 24 hr tablet Take 2 tablets (1,000 mg total) by mouth daily. 90 tablet 0  . norethindrone (HEATHER) 0.35 MG tablet Take 1 tablet by mouth daily.    . sitaGLIPtin (JANUVIA) 50 MG tablet Take 1 tablet (50 mg total) by mouth daily. 90 tablet 0   No current facility-administered medications for this visit.   No Known Allergies

## 2019-11-24 DIAGNOSIS — Z23 Encounter for immunization: Secondary | ICD-10-CM | POA: Diagnosis not present

## 2020-01-01 ENCOUNTER — Other Ambulatory Visit: Payer: Self-pay | Admitting: Osteopathic Medicine

## 2020-01-02 ENCOUNTER — Other Ambulatory Visit: Payer: Self-pay | Admitting: Osteopathic Medicine

## 2020-01-23 ENCOUNTER — Other Ambulatory Visit: Payer: Self-pay

## 2020-01-23 ENCOUNTER — Ambulatory Visit: Payer: BC Managed Care – PPO | Admitting: Osteopathic Medicine

## 2020-01-23 ENCOUNTER — Other Ambulatory Visit: Payer: Self-pay | Admitting: Osteopathic Medicine

## 2020-01-23 DIAGNOSIS — E1165 Type 2 diabetes mellitus with hyperglycemia: Secondary | ICD-10-CM

## 2020-01-23 NOTE — Progress Notes (Signed)
Patient missed appt,ordered A1C so results could be obtained and able to review on new appt day/time

## 2020-02-09 ENCOUNTER — Other Ambulatory Visit: Payer: Self-pay | Admitting: Osteopathic Medicine

## 2020-02-17 ENCOUNTER — Other Ambulatory Visit: Payer: Self-pay | Admitting: Osteopathic Medicine

## 2020-02-17 DIAGNOSIS — Z1231 Encounter for screening mammogram for malignant neoplasm of breast: Secondary | ICD-10-CM | POA: Diagnosis not present

## 2020-03-02 ENCOUNTER — Ambulatory Visit: Payer: Self-pay

## 2020-03-02 ENCOUNTER — Other Ambulatory Visit: Payer: Self-pay

## 2020-03-02 ENCOUNTER — Ambulatory Visit
Admission: EM | Admit: 2020-03-02 | Discharge: 2020-03-02 | Disposition: A | Discharge status: Home / Self Care | Payer: BC Managed Care – PPO | Attending: Physician Assistant | Admitting: Physician Assistant

## 2020-03-02 DIAGNOSIS — M545 Low back pain, unspecified: Secondary | ICD-10-CM

## 2020-03-02 DIAGNOSIS — R519 Headache, unspecified: Secondary | ICD-10-CM

## 2020-03-02 DIAGNOSIS — M546 Pain in thoracic spine: Secondary | ICD-10-CM

## 2020-03-02 MED ORDER — MELOXICAM 7.5 MG PO TABS
7.5000 mg | ORAL_TABLET | Freq: Every day | ORAL | 0 refills | Status: DC
Start: 2020-03-02 — End: 2020-10-14

## 2020-03-02 MED ORDER — TIZANIDINE HCL 2 MG PO TABS
2.0000 mg | ORAL_TABLET | Freq: Three times a day (TID) | ORAL | 0 refills | Status: DC | PRN
Start: 2020-03-02 — End: 2020-10-14
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
## 2020-04-12 ENCOUNTER — Other Ambulatory Visit: Payer: Self-pay | Admitting: Osteopathic Medicine

## 2020-04-17 ENCOUNTER — Ambulatory Visit: Payer: BC Managed Care – PPO | Admitting: Osteopathic Medicine

## 2020-05-14 ENCOUNTER — Other Ambulatory Visit: Payer: Self-pay | Admitting: Osteopathic Medicine

## 2020-05-14 ENCOUNTER — Encounter: Payer: Self-pay | Admitting: Osteopathic Medicine

## 2020-05-14 ENCOUNTER — Ambulatory Visit (INDEPENDENT_AMBULATORY_CARE_PROVIDER_SITE_OTHER): Payer: BC Managed Care – PPO | Admitting: Osteopathic Medicine

## 2020-05-14 ENCOUNTER — Other Ambulatory Visit: Payer: Self-pay

## 2020-05-14 DIAGNOSIS — E1165 Type 2 diabetes mellitus with hyperglycemia: Secondary | ICD-10-CM | POA: Diagnosis not present

## 2020-05-14 LAB — POCT GLYCOSYLATED HEMOGLOBIN (HGB A1C)
HbA1c POC (<> result, manual entry): 9.6 % (ref 4.0–5.6)
HbA1c, POC (controlled diabetic range): 9.6 % — AB (ref 0.0–7.0)
HbA1c, POC (prediabetic range): 9.6 % — AB (ref 5.7–6.4)
Hemoglobin A1C: 9.6 % — AB (ref 4.0–5.6)

## 2020-05-14 MED ORDER — METFORMIN HCL ER 500 MG PO TB24: 1000.0000 mg | ORAL_TABLET | Freq: Every day | ORAL | 3 refills | Status: DC

## 2020-05-14 MED ORDER — SITAGLIPTIN PHOSPHATE 100 MG PO TABS
50.0000 mg | ORAL_TABLET | Freq: Every day | ORAL | 3 refills | Status: DC
Start: 2020-05-14 — End: 2020-09-05

## 2020-05-14 MED ORDER — GLUCOSE BLOOD VI STRP: 1.0000 | ORAL_STRIP | 99 refills | Status: DC | PRN

## 2020-05-14 MED ORDER — FREESTYLE LIBRE 14 DAY SENSOR MISC: 99 refills | Status: DC

## 2020-05-14 MED ORDER — LISINOPRIL 2.5 MG PO TABS
2.5000 mg | ORAL_TABLET | Freq: Every day | ORAL | 3 refills | Status: DC
Start: 2020-05-14 — End: 2021-05-01

## 2020-05-14 MED ORDER — FREESTYLE LIBRE 14 DAY READER DEVI: 99 refills | Status: DC

## 2020-05-14 MED ORDER — NORETHINDRONE 0.35 MG PO TABS: 1.0000 | ORAL_TABLET | Freq: Every day | ORAL | 3 refills | Status: DC

## 2020-05-14 MED ORDER — EMPAGLIFLOZIN 25 MG PO TABS
25.0000 mg | ORAL_TABLET | Freq: Every day | ORAL | 3 refills | Status: DC
Start: 2020-05-14 — End: 2021-05-01

## 2020-05-14 MED ORDER — ATORVASTATIN CALCIUM 40 MG PO TABS
40.0000 mg | ORAL_TABLET | Freq: Every day | ORAL | 3 refills | Status: DC
Start: 2020-05-14 — End: 2021-05-01

## 2020-05-14 NOTE — Progress Notes (Signed)
Haley Steele is a 49 y.o. female who presents to  [place] [facility]  today, 05/14/20, seeking care for the following:  . DM2 follow-up. Last A1C >14 in 10/2019 --> 9.6 today!    ASSESSMENT & PLAN with other pertinent findings:  The encounter diagnosis was Type 2 diabetes mellitus with hyperglycemia, without long-term current use of insulin ([place]).   Improved! Not quite to goal.  --> increase Januvia, optimize diet/exercise        There are no Patient Instructions on file for this visit.  Orders Placed This Encounter  Procedures  . POCT HgB A1C    Meds ordered this encounter  Medications  . norethindrone (HEATHER) 0.35 MG tablet    Sig: Take 1 tablet (0.35 mg total) by mouth daily.    Dispense:  84 tablet    Refill:  3  . metFORMIN (GLUCOPHAGE-XR) 500 MG 24 hr tablet    Sig: Take 2 tablets (1,000 mg total) by mouth daily.    Dispense:  180 tablet    Refill:  3  . lisinopril (ZESTRIL) 2.5 MG tablet    Sig: Take 1 tablet (2.5 mg total) by mouth daily.    Dispense:  90 tablet    Refill:  3  . empagliflozin (JARDIANCE) 25 MG TABS tablet    Sig: Take 1 tablet (25 mg total) by mouth daily.    Dispense:  90 tablet    Refill:  3  . sitaGLIPtin (JANUVIA) 100 MG tablet    Sig: Take 0.5 tablets (50 mg total) by mouth daily.    Dispense:  90 tablet    Refill:  3  . atorvastatin (LIPITOR) 40 MG tablet    Sig: Take 1 tablet (40 mg total) by mouth daily.    Dispense:  90 tablet    Refill:  3  . DISCONTD: glucose blood test strip    Sig: 1 each by Other route as needed for other (up to qid). Use as instructed    Dispense:  100 each    Refill:  99  . Continuous Blood Gluc Receiver (FREESTYLE LIBRE 14 DAY READER) DEVI    Sig: Use as directed    Dispense:  1 each    Refill:  99  . Continuous Blood Gluc Sensor (FREESTYLE LIBRE 14 DAY SENSOR) MISC    Sig: Use as directed    Dispense:  6 each    Refill:  99        Follow-up instructions: Return for RECHECK A1C IN OFFICE IN 3-4 MONTHS - see [place] soner if needed! .                                         BP 119/83 (BP Location: Left Arm, Patient Position: Sitting)   Pulse (!) 101   Temp (!) 97 F (36.1 C)   Ht 5\' 4"  (1.626 m)   SpO2 98%   BMI 31.07 kg/m   No outpatient medications have been marked as taking for the 05/14/20 encounter (Office Visit) with Emeterio Reeve, DO.    Results for orders placed or performed in visit on 05/14/20 (from the past 72 hour(s))  POCT HgB A1C     Status: Abnormal   Collection Time: 05/14/20  3:33 PM  Result Value Ref Range   Hemoglobin A1C 9.6 (A) 4.0 - 5.6 %  HbA1c POC (<> result, manual entry) 9.6 4.0 - 5.6 %   HbA1c, POC (prediabetic range) 9.6 (A) 5.7 - 6.4 %   HbA1c, POC (controlled diabetic range) 9.6 (A) 0.0 - 7.0 %    No results found.     All questions at time of visit were answered - patient instructed to contact office with any additional concerns or updates.  ER/RTC precautions were reviewed with the patient as applicable.   Please note: voice recognition software was used to produce this document, and typos may escape review. Please contact Dr. Sheppard Coil for any needed clarifications.

## 2020-05-23 NOTE — Telephone Encounter (Signed)
This encounter was created in error - please disregard.

## 2020-07-02 ENCOUNTER — Telehealth: Payer: Self-pay

## 2020-07-02 NOTE — Telephone Encounter (Signed)
Pt called stating it was too expensive to get the meter and supplies for her DM check. She mentioned that provider told her she can get a meter from the clinic. Pls advise, thanks.   She is requesting if provider can send in a different test strip - requesting One Touch Ultra Blue. Pls send to the pharmacy.

## 2020-07-03 MED ORDER — GLUCOSE BLOOD VI STRP: ORAL_STRIP | 99 refills | Status: DC

## 2020-07-03 MED ORDER — FREESTYLE LIBRE 14 DAY READER DEVI
99 refills | Status: DC
Start: 2020-07-03 — End: 2022-11-12

## 2020-07-03 NOTE — Telephone Encounter (Signed)
Patient advised.

## 2020-07-03 NOTE — Telephone Encounter (Signed)
We have samples of the sensor but not the reader device. She will still hae to get future prescriptions of the sensors, if insurance won't cover these then we should reconsider giving her the sample sensor. I left sample for the sensor device and printed Rx for reader device up front if she wants these.   I sent Rx for the strips

## 2020-08-11 ENCOUNTER — Telehealth: Payer: BC Managed Care – PPO | Admitting: Family

## 2020-08-11 DIAGNOSIS — H109 Unspecified conjunctivitis: Secondary | ICD-10-CM | POA: Diagnosis not present

## 2020-08-11 MED ORDER — POLYMYXIN B-TRIMETHOPRIM 10000-0.1 UNIT/ML-% OP SOLN: 1.0000 [drp] | Freq: Four times a day (QID) | OPHTHALMIC | 0 refills | Status: DC

## 2020-08-11 NOTE — Progress Notes (Signed)
E-Visit for Pink Eye   We are sorry that you are not feeling well.  Here is how we plan to help!  Based on what you have shared with me it looks like you have conjunctivitis.  Conjunctivitis is a common inflammatory or infectious condition of the eye that is often referred to as "pink eye".  In most cases it is contagious (viral or bacterial). However, not all conjunctivitis requires antibiotics (ex. Allergic).  We have made appropriate suggestions for you based upon your presentation.  I have prescribed Polytrim Ophthalmic drops 1-2 drops 4 times a day times 5 days  Pink eye can be highly contagious.  It is typically spread through direct contact with secretions, or contaminated objects or surfaces that one may have touched.  Strict handwashing is suggested with soap and water is urged.  If not available, use alcohol based had sanitizer.  Avoid unnecessary touching of the eye.  If you wear contact lenses, you will need to refrain from wearing them until you see no white discharge from the eye for at least 24 hours after being on medication.  You should see symptom improvement in 1-2 days after starting the medication regimen.  Call us if symptoms are not improved in 1-2 days.  Home Care:  Wash your hands often!  Do not wear your contacts until you complete your treatment plan.  Avoid sharing towels, bed linen, personal items with a person who has pink eye.  See attention for anyone in your home with similar symptoms.  Get Help Right Away If:  Your symptoms do not improve.  You develop blurred or loss of vision.  Your symptoms worsen (increased discharge, pain or redness)  Your e-visit answers were reviewed by a board certified advanced clinical practitioner to complete your personal care plan.  Depending on the condition, your plan could have included both over the counter or prescription medications.  If there is a problem please reply  once you have received a response from your  provider.  Your safety is important to us.  If you have drug allergies check your prescription carefully.    You can use MyChart to ask questions about today's visit, request a non-urgent call back, or ask for a work or school excuse for 24 hours related to this e-Visit. If it has been greater than 24 hours you will need to follow up with your provider, or enter a new e-Visit to address those concerns.   You will get an e-mail in the next two days asking about your experience.  I hope that your e-visit has been valuable and will speed your recovery. Thank you for using e-visits.  Approximately 5 minutes was spent documenting and reviewing patient's chart.      

## 2020-08-14 ENCOUNTER — Ambulatory Visit: Payer: BC Managed Care – PPO | Admitting: Osteopathic Medicine

## 2020-08-20 ENCOUNTER — Ambulatory Visit: Payer: BC Managed Care – PPO | Admitting: Osteopathic Medicine

## 2020-09-05 ENCOUNTER — Ambulatory Visit (INDEPENDENT_AMBULATORY_CARE_PROVIDER_SITE_OTHER): Payer: BC Managed Care – PPO | Admitting: Osteopathic Medicine

## 2020-09-05 ENCOUNTER — Encounter: Payer: Self-pay | Admitting: Osteopathic Medicine

## 2020-09-05 ENCOUNTER — Other Ambulatory Visit: Payer: Self-pay

## 2020-09-05 VITALS — BP 137/85 | HR 74 | Temp 97.6°F | Wt 184.0 lb

## 2020-09-05 DIAGNOSIS — R350 Frequency of micturition: Secondary | ICD-10-CM | POA: Diagnosis not present

## 2020-09-05 DIAGNOSIS — E1165 Type 2 diabetes mellitus with hyperglycemia: Secondary | ICD-10-CM | POA: Diagnosis not present

## 2020-09-05 LAB — POCT GLYCOSYLATED HEMOGLOBIN (HGB A1C): Hemoglobin A1C: 8.5 % — AB (ref 4.0–5.6)

## 2020-09-05 MED ORDER — OZEMPIC (0.25 OR 0.5 MG/DOSE) 2 MG/1.5ML ~~LOC~~ SOPN: 0.5000 mg | PEN_INJECTOR | SUBCUTANEOUS | 3 refills | Status: DC

## 2020-09-05 MED ORDER — SITAGLIPTIN PHOSPHATE 100 MG PO TABS: 100.0000 mg | ORAL_TABLET | Freq: Every day | ORAL | 3 refills | Status: DC

## 2020-09-05 NOTE — Progress Notes (Signed)
HPI: Haley Steele is a 50 y.o. female who  has a past medical history of Diabetes mellitus without complication ([place]).  she presents to [facility] today, 09/05/20,  for chief complaint of:  DM2 follow-up  A1C 8.5 today, 3 mos ago was 9.6  Better but not at goal Some room for improvement diet/exercise     ASSESSMENT/PLAN: The primary encounter diagnosis was Type 2 diabetes mellitus with hyperglycemia, without long-term current use of insulin ([place]). A diagnosis of Urine frequency was also pertinent to this visit.    Orders Placed This Encounter  Procedures  . Urine Culture  . Urinalysis, Routine w reflex microscopic  . POCT HgB A1C     Meds ordered this encounter  Medications  . Semaglutide,0.25 or 0.5MG /DOS, (OZEMPIC, 0.25 OR 0.5 MG/DOSE,) 2 MG/1.5ML SOPN    Sig: Inject 0.5 mg into the skin once a week.    Dispense:  7.5 mL    Refill:  3                       Patient Instructions  Plan:  Adding Ozmepic for A1C/sugar and weight loss   Urine testing today - depending on results, option to add Rx for urinary bladder spasm if desired.        Follow-up plan: Return in about 3 months (around 12/04/2020) for MONMITOR A1C - SEE [place] SOONER IF NEEDED .                                                 ################################################# ################################################# ################################################# #################################################    Current Meds  Medication Sig  . atorvastatin (LIPITOR) 40 MG tablet Take 1 tablet (40 mg total) by mouth daily.  . Continuous Blood Gluc Receiver (FREESTYLE LIBRE 14 DAY READER) DEVI Use as directed  . Continuous Blood Gluc Sensor (FREESTYLE LIBRE 14 DAY SENSOR) MISC Use as directed  . empagliflozin (JARDIANCE) 25 MG TABS tablet Take 1 tablet (25 mg total) by mouth daily.  Marland Kitchen  glucose blood test strip Use up to 4 times per day as directed with glucometer. Disp: 100. Refill x99  . lisinopril (ZESTRIL) 2.5 MG tablet Take 1 tablet (2.5 mg total) by mouth daily.  . meloxicam (MOBIC) 7.5 MG tablet Take 1 tablet (7.5 mg total) by mouth daily.  . metFORMIN (GLUCOPHAGE-XR) 500 MG 24 hr tablet Take 2 tablets (1,000 mg total) by mouth daily.  . norethindrone (HEATHER) 0.35 MG tablet Take 1 tablet (0.35 mg total) by mouth daily.  . Semaglutide,0.25 or 0.5MG /DOS, (OZEMPIC, 0.25 OR 0.5 MG/DOSE,) 2 MG/1.5ML SOPN Inject 0.5 mg into the skin once a week.  Marland Kitchen tiZANidine (ZANAFLEX) 2 MG tablet Take 1 tablet (2 mg total) by mouth every 8 (eight) hours as needed for muscle spasms.  . [DISCONTINUED] sitaGLIPtin (JANUVIA) 100 MG tablet Take 0.5 tablets (50 mg total) by mouth daily.  . [DISCONTINUED] trimethoprim-polymyxin b (POLYTRIM) ophthalmic solution Place 1 drop into the left eye every 6 (six) hours.    No Known Allergies     Review of Systems: Pertinent (+) and (-) ROS in HPI as above   Exam:  BP 137/85 (BP Location: Left Arm, Patient Position: Sitting, Cuff Size: Normal)   Pulse 74   Temp 97.6 F (36.4 C) (Oral)   Wt 184 lb (83.5 kg)  BMI 31.58 kg/m   Constitutional: VS see above. General Appearance: alert, well-developed, well-nourished, NAD  Neck: No masses, trachea midline.   Respiratory: Normal respiratory effort. no wheeze, no rhonchi, no rales  Cardiovascular: S1/S2 normal, no murmur, no rub/gallop auscultated. RRR.   Musculoskeletal: Gait normal. Symmetric and independent movement of all extremities  Abdominal: non-tender, non-distended, no appreciable organomegaly, neg Murphy's, BS WNLx4  Neurological: Normal balance/coordination. No tremor.  Skin: warm, dry, intact.   Psychiatric: Normal judgment/insight. Normal mood and affect. Oriented x3.       Visit summary with medication list and pertinent instructions was printed for patient to review,  patient was advised to alert [place] if any updates are needed. All questions at time of visit were answered - patient instructed to contact office with any additional concerns. ER/RTC precautions were reviewed with the patient and understanding verbalized.      Please note: voice recognition software was used to produce this document, and typos may escape review. Please contact Dr. Sheppard Coil for any needed clarifications.    Follow up plan: Return in about 3 months (around 12/04/2020) for MONMITOR A1C - SEE [place] SOONER IF NEEDED .

## 2020-09-05 NOTE — Patient Instructions (Signed)
Plan:  Adding Ozmepic for A1C/sugar and weight loss   Urine testing today - depending on results, option to add Rx for urinary bladder spasm if desired.

## 2020-09-07 LAB — URINALYSIS, ROUTINE W REFLEX MICROSCOPIC
Bacteria, UA: NONE SEEN /HPF
Bilirubin Urine: NEGATIVE
Hyaline Cast: NONE SEEN /LPF
Ketones, ur: NEGATIVE
Leukocytes,Ua: NEGATIVE
Nitrite: NEGATIVE
Specific Gravity, Urine: 1.039 — ABNORMAL HIGH (ref 1.001–1.03)
pH: <=5 (ref 5.0–8.0)

## 2020-09-07 LAB — URINE CULTURE
MICRO NUMBER:: 11467832
Result:: NO GROWTH
SPECIMEN QUALITY:: ADEQUATE

## 2020-09-11 ENCOUNTER — Encounter: Payer: Self-pay | Admitting: Osteopathic Medicine

## 2020-10-08 ENCOUNTER — Ambulatory Visit: Payer: BC Managed Care – PPO | Admitting: Osteopathic Medicine

## 2020-10-14 ENCOUNTER — Other Ambulatory Visit: Payer: Self-pay

## 2020-10-14 ENCOUNTER — Encounter: Payer: Self-pay | Admitting: Rehabilitative and Restorative Service Providers"

## 2020-10-14 ENCOUNTER — Ambulatory Visit (INDEPENDENT_AMBULATORY_CARE_PROVIDER_SITE_OTHER): Payer: BC Managed Care – PPO | Admitting: Osteopathic Medicine

## 2020-10-14 ENCOUNTER — Ambulatory Visit (INDEPENDENT_AMBULATORY_CARE_PROVIDER_SITE_OTHER): Payer: BC Managed Care – PPO

## 2020-10-14 ENCOUNTER — Encounter: Payer: Self-pay | Admitting: Osteopathic Medicine

## 2020-10-14 ENCOUNTER — Ambulatory Visit (INDEPENDENT_AMBULATORY_CARE_PROVIDER_SITE_OTHER): Payer: BC Managed Care – PPO | Admitting: Rehabilitative and Restorative Service Providers"

## 2020-10-14 VITALS — BP 117/80 | HR 76 | Temp 98.1°F | Wt 182.1 lb

## 2020-10-14 DIAGNOSIS — S29012A Strain of muscle and tendon of back wall of thorax, initial encounter: Secondary | ICD-10-CM | POA: Diagnosis not present

## 2020-10-14 DIAGNOSIS — M50322 Other cervical disc degeneration at C5-C6 level: Secondary | ICD-10-CM | POA: Diagnosis not present

## 2020-10-14 DIAGNOSIS — S161XXA Strain of muscle, fascia and tendon at neck level, initial encounter: Secondary | ICD-10-CM | POA: Diagnosis not present

## 2020-10-14 DIAGNOSIS — M539 Dorsopathy, unspecified: Secondary | ICD-10-CM

## 2020-10-14 DIAGNOSIS — G44209 Tension-type headache, unspecified, not intractable: Secondary | ICD-10-CM | POA: Diagnosis not present

## 2020-10-14 DIAGNOSIS — G4486 Cervicogenic headache: Secondary | ICD-10-CM

## 2020-10-14 DIAGNOSIS — M4802 Spinal stenosis, cervical region: Secondary | ICD-10-CM | POA: Diagnosis not present

## 2020-10-14 DIAGNOSIS — M545 Low back pain, unspecified: Secondary | ICD-10-CM

## 2020-10-14 DIAGNOSIS — R29898 Other symptoms and signs involving the musculoskeletal system: Secondary | ICD-10-CM

## 2020-10-14 DIAGNOSIS — M546 Pain in thoracic spine: Secondary | ICD-10-CM | POA: Diagnosis not present

## 2020-10-14 DIAGNOSIS — R293 Abnormal posture: Secondary | ICD-10-CM

## 2020-10-14 DIAGNOSIS — M4602 Spinal enthesopathy, cervical region: Secondary | ICD-10-CM | POA: Diagnosis not present

## 2020-10-14 DIAGNOSIS — M778 Other enthesopathies, not elsewhere classified: Secondary | ICD-10-CM | POA: Diagnosis not present

## 2020-10-14 MED ORDER — NAPROXEN 500 MG PO TABS: 500.0000 mg | ORAL_TABLET | Freq: Two times a day (BID) | ORAL | 3 refills | Status: DC

## 2020-10-14 MED ORDER — METHOCARBAMOL 500 MG PO TABS
500.0000 mg | ORAL_TABLET | Freq: Three times a day (TID) | ORAL | 0 refills | Status: DC | PRN
Start: 2020-10-14 — End: 2021-06-12

## 2020-10-14 NOTE — Patient Instructions (Addendum)
TENS UNIT: This is helpful for muscle pain and spasm.   Search and Purchase a TENS 7000 2nd edition at www.tenspros.com. It should be less than $30.     TENS unit instructions: Do not shower or bathe with the unit on Turn the unit off before removing electrodes or batteries If the electrodes lose stickiness add a drop of water to the electrodes after they are disconnected from the unit and place on plastic sheet. If you continued to have difficulty, call the TENS unit company to purchase more electrodes. Do not apply lotion on the skin area prior to use. Make sure the skin is clean and dry as this will help prolong the life of the electrodes. After use, always check skin for unusual red areas, rash or other skin difficulties. If there are any skin problems, does not apply electrodes to the same area. Never remove the electrodes from the unit by pulling the wires. Do not use the TENS unit or electrodes other than as directed. Do not change electrode placement without consultating your therapist or physician. Keep 2 fingers with between each electrode.   Access Code: [email or web address]: [email or web address]: 03/07/2022Prepared by: Lashun Ramseyer HoltExercises  Standing Backward Shoulder Rolls - 2 x daily - 7 x weekly - 1 sets - 10 reps - 1-2 sec hold  Seated Cervical Retraction - 2 x daily - 7 x weekly - 1-2 sets - 5-10 reps - 10 sec hold  Seated Scapular Retraction - 2 x daily - 7 x weekly - 1-2 sets - 10 reps - 10 sec hold  Shoulder External Rotation and Scapular Retraction - 2 x daily - 7 x weekly - 1 sets - 10 reps - 5 sec hold  Shoulder External Rotation in 45 Degrees Abduction - 2 x daily - 7 x weekly - 1-2 sets - 10 reps - 3 sec hold  Seated Cervical Rotation AROM - 2 x daily - 7 x weekly - 1 sets - 5 reps - 2-3 sec hold  Seated Cervical Sidebending AROM - 2 x daily - 7 x weekly - 1 sets - 5 reps - 5-10 sec hold
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
## 2020-10-18 ENCOUNTER — Other Ambulatory Visit: Payer: Self-pay

## 2020-10-18 ENCOUNTER — Ambulatory Visit (INDEPENDENT_AMBULATORY_CARE_PROVIDER_SITE_OTHER): Payer: BC Managed Care – PPO | Admitting: Physical Therapy

## 2020-10-18 DIAGNOSIS — M546 Pain in thoracic spine: Secondary | ICD-10-CM

## 2020-10-18 DIAGNOSIS — R29898 Other symptoms and signs involving the musculoskeletal system: Secondary | ICD-10-CM

## 2020-10-18 DIAGNOSIS — G4486 Cervicogenic headache: Secondary | ICD-10-CM

## 2020-10-18 DIAGNOSIS — M539 Dorsopathy, unspecified: Secondary | ICD-10-CM | POA: Diagnosis not present

## 2020-10-18 DIAGNOSIS — R293 Abnormal posture: Secondary | ICD-10-CM

## 2020-10-18 NOTE — Therapy (Signed)
[place], [place], [address] Phone: [phone]   Fax:  [phone]  Physical Therapy Treatment  Patient Details  Name: Haley Steele MRN: [id number] Date of Birth: Aug 21, 1970 Referring Provider (PT): Dr Emeterio Reeve   Encounter Date: 10/18/2020   PT End of Session - 10/18/20 1526    Visit Number 2    Number of Visits 12    Date for PT Re-Evaluation 11/25/20    PT Start Time 3382    PT Stop Time 1532    PT Time Calculation (min) 45 min    Activity Tolerance Patient tolerated treatment well    Behavior During Therapy [facility] for tasks assessed/performed           Past Medical History:  Diagnosis Date  . Diabetes mellitus without complication ([place])     No past surgical history on file.  There were no vitals filed for this visit.   Subjective Assessment - 10/18/20 1452    Subjective Pt states her low back is hurting more today, it was doing well the past few days so she didn't take pain medicine today.    Currently in Pain? Yes    Pain Score 7     Pain Location Back    Pain Orientation Lower    Pain Descriptors / Indicators Aching                             [facility] Adult PT Treatment/Exercise - 10/18/20 0001      Exercises   Exercises Lumbar      Lumbar Exercises: Stretches   Passive Hamstring Stretch Left;2 reps;30 seconds    Passive Hamstring Stretch Limitations supine    Figure 4 Stretch 30 seconds;2 reps    Figure 4 Stretch Limitations left side with gentle overpressure      Lumbar Exercises: Aerobic   Nustep L5 x 5 mins for warm up UE/LE      Lumbar Exercises: Supine   Other Supine Lumbar Exercises lower trunk rotation x 10 bilat in pain free range      Moist Heat Therapy   Number Minutes Moist Heat 10 Minutes    Moist Heat Location Lumbar Spine      Electrical Stimulation   Electrical Stimulation Location lumbar    Electrical Stimulation  Action IFC    Electrical Stimulation Parameters to tolerance    Electrical Stimulation Goals Pain      Manual Therapy   Manual Therapy Soft tissue mobilization    Soft tissue mobilization STM to tolerance for lumbar, thoracic and cervical paraspinals to reduce mm spasticity and reduce pain      Neck Exercises: Stretches   Other Neck Stretches cervical rotation x 5 bilat, cervical retraction x 10 with 3 sec hold                       PT Long Term Goals - 10/14/20 1646      PT LONG TERM GOAL #1   Title Improve posture and alignment with patient to demonstrate improved upright posture with decreased movement guarding and tightness    Time 6    Period Weeks    Status New    Target Date 11/25/20      PT LONG TERM GOAL #2   Title Increase cervical ROM by 5-10 deg in all planes pain free    Time 6    Period  Weeks    Status New    Target Date 11/25/20      PT LONG TERM GOAL #3   Title Decrease cervical/thoracic/lumbar pain; headaches by 75% allowing patient to work and sleep with pain >/= 1-2/10 at most    Time 6    Period Weeks    Status New    Target Date 11/25/20      PT LONG TERM GOAL #4   Title Independent in HEP    Time 6    Period Weeks    Status New    Target Date 11/25/20      PT LONG TERM GOAL #5   Title Improve functional limitatioin score to 69    Time 6    Period Weeks    Status New    Target Date 11/25/20                 Plan - 10/18/20 1527    Clinical Impression Statement Pt with increased low back pain this session. Session focused on decreasing low back pain and improving lumbar mobility . Pt with good response to STM and bridging. LTR and bridge added to HEP    PT Next Visit Plan assess addition of lumbar exercises to HEP.  Progress core and scapular strengthening, posture exercises.  manual and modalities as indicated    PT Home Exercise Plan [email or web address]    Consulted and Agree with Plan of Care Patient           Patient will  benefit from skilled therapeutic intervention in order to improve the following deficits and impairments:     Visit Diagnosis: Cervical dysfunction  Cervicogenic headache  Pain in thoracic spine  Other symptoms and signs involving the musculoskeletal system  Abnormal posture     Problem List Patient Active Problem List   Diagnosis Date Noted  . Abnormal Pap smear of cervix 11/21/2017  . Uterine leiomyoma 11/21/2017  . Headache 11/21/2017  . Tingling 11/04/2017  . Type 2 diabetes mellitus ([place]) 03/23/2017   Zayvon Alicea, PT  Naiyana Barbian 10/18/2020, 3:29 PM  [facility] [place], [place], [address] Phone: [phone]   Fax:  [phone]  Name: Haley Steele MRN: [id number] Date of Birth: 10-May-1971

## 2020-10-18 NOTE — Patient Instructions (Signed)
Access Code: [email or web address] URL: [email or web address] Date: 10/18/2020 Prepared by: Isabelle Course  Exercises Standing Backward Shoulder Rolls - 2 x daily - 7 x weekly - 1 sets - 10 reps - 1-2 sec hold Seated Cervical Retraction - 2 x daily - 7 x weekly - 1-2 sets - 5-10 reps - 10 sec hold Seated Scapular Retraction - 2 x daily - 7 x weekly - 1-2 sets - 10 reps - 10 sec hold Shoulder External Rotation and Scapular Retraction - 2 x daily - 7 x weekly - 1 sets - 10 reps - 5 sec hold Shoulder External Rotation in 45 Degrees Abduction - 2 x daily - 7 x weekly - 1-2 sets - 10 reps - 3 sec hold Seated Cervical Rotation AROM - 2 x daily - 7 x weekly - 1 sets - 5 reps - 2-3 sec hold Seated Cervical Sidebending AROM - 2 x daily - 7 x weekly - 1 sets - 5 reps - 5-10 sec hold Supine Lower Trunk Rotation - 1 x daily - 7 x weekly - 2 sets - 10 reps Supine Bridge - 1 x daily - 7 x weekly - 2 sets - 10 reps

## 2020-10-22 ENCOUNTER — Telehealth: Payer: Self-pay | Admitting: Physical Therapy

## 2020-10-22 ENCOUNTER — Encounter: Payer: BC Managed Care – PPO | Admitting: Physical Therapy

## 2020-10-22 NOTE — Telephone Encounter (Signed)
Patient did not show for physical therapy appointment.  Called and left HIPAA compliant voice mail for patient to return phone call to [phone] to confirm upcoming appointments.    Kerin Perna, PTA 10/22/20 8:24 AM

## 2020-10-24 ENCOUNTER — Encounter: Payer: Self-pay | Admitting: Physical Therapy

## 2020-10-25 ENCOUNTER — Other Ambulatory Visit: Payer: Self-pay

## 2020-10-25 ENCOUNTER — Ambulatory Visit (INDEPENDENT_AMBULATORY_CARE_PROVIDER_SITE_OTHER): Payer: BC Managed Care – PPO | Admitting: Physical Therapy

## 2020-10-25 DIAGNOSIS — R293 Abnormal posture: Secondary | ICD-10-CM

## 2020-10-25 DIAGNOSIS — G4486 Cervicogenic headache: Secondary | ICD-10-CM

## 2020-10-25 DIAGNOSIS — M546 Pain in thoracic spine: Secondary | ICD-10-CM

## 2020-10-25 DIAGNOSIS — M539 Dorsopathy, unspecified: Secondary | ICD-10-CM | POA: Diagnosis not present

## 2020-10-25 DIAGNOSIS — R29898 Other symptoms and signs involving the musculoskeletal system: Secondary | ICD-10-CM

## 2020-10-25 NOTE — Therapy (Signed)
[place], [place], [address] Phone: [phone]   Fax:  [phone]  Physical Therapy Treatment  Patient Details  Name: Haley Steele MRN: [id number] Date of Birth: August 13, 1970 Referring Provider (PT): Dr Emeterio Reeve   Encounter Date: 10/25/2020   PT End of Session - 10/25/20 1012    Visit Number 3    Number of Visits 12    Date for PT Re-Evaluation 11/25/20    PT Start Time 0930    PT Stop Time 1012    PT Time Calculation (min) 42 min    Activity Tolerance Patient tolerated treatment well    Behavior During Therapy [facility] for tasks assessed/performed           Past Medical History:  Diagnosis Date  . Diabetes mellitus without complication ([place])     No past surgical history on file.  There were no vitals filed for this visit.   Subjective Assessment - 10/25/20 0936    Subjective Pt states she has had some "twinges" in her Lt shoulder but it is "not too bad".  States her low back is "tender" but "not too painful"    Currently in Pain? Yes    Pain Score 2     Pain Location Neck    Pain Orientation Left    Pain Descriptors / Indicators Tightness                             [facility] Adult PT Treatment/Exercise - 10/25/20 0001      Exercises   Exercises Neck      Neck Exercises: Machines for Strengthening   UBE (Upper Arm Bike) 4 min alt fwd/bkwd level 2      Neck Exercises: Standing   Other Standing Exercises low rows red TB x 10, scapular depression red TB x 10      Neck Exercises: Seated   W Back 15 reps    W Back Weights (lbs) red TB      Neck Exercises: Supine   Other Supine Exercise horizontal abduction x 10 red TB      Lumbar Exercises: Stretches   Other Lumbar Stretch Exercise lower trunk rotation 3 sec hold x 10 each side      Lumbar Exercises: Supine   Bent Knee Raise 10 reps    Bridge 15 reps      Manual Therapy   Soft tissue  mobilization STM bilat upper trap, levator, cervical paraspinals, suboccipitals      Neck Exercises: Stretches   Upper Trapezius Stretch 20 seconds;2 reps;Left;Right    Levator Stretch 20 seconds;2 reps;Left;Right    Other Neck Stretches cervical rotation x 5 bilat, cervical lateral flexion x 5 bilat                  PT Education - 10/25/20 1011    Education Details updated HEP    Person(s) Educated Patient    Methods Explanation;Demonstration;Handout    Comprehension Verbalized understanding;Returned demonstration               PT Long Term Goals - 10/14/20 1646      PT LONG TERM GOAL #1   Title Improve posture and alignment with patient to demonstrate improved upright posture with decreased movement guarding and tightness    Time 6    Period Weeks    Status New    Target Date 11/25/20  PT LONG TERM GOAL #2   Title Increase cervical ROM by 5-10 deg in all planes pain free    Time 6    Period Weeks    Status New    Target Date 11/25/20      PT LONG TERM GOAL #3   Title Decrease cervical/thoracic/lumbar pain; headaches by 75% allowing patient to work and sleep with pain >/= 1-2/10 at most    Time 6    Period Weeks    Status New    Target Date 11/25/20      PT LONG TERM GOAL #4   Title Independent in HEP    Time 6    Period Weeks    Status New    Target Date 11/25/20      PT LONG TERM GOAL #5   Title Improve functional limitatioin score to 69    Time 6    Period Weeks    Status New    Target Date 11/25/20                 Plan - 10/25/20 1012    Clinical Impression Statement Pt able to tolerate addition onf postural exercises this session. Pt responds well to [facility] for neck/shoulder stiffness    PT Next Visit Plan progress core strength, postural strength, manual as indicated    PT Home Exercise Plan [email or web address]    Consulted and Agree with Plan of Care Patient           Patient will benefit from skilled therapeutic intervention in  order to improve the following deficits and impairments:     Visit Diagnosis: Cervical dysfunction  Cervicogenic headache  Other symptoms and signs involving the musculoskeletal system  Pain in thoracic spine  Abnormal posture     Problem List Patient Active Problem List   Diagnosis Date Noted  . Abnormal Pap smear of cervix 11/21/2017  . Uterine leiomyoma 11/21/2017  . Headache 11/21/2017  . Tingling 11/04/2017  . Type 2 diabetes mellitus ([place]) 03/23/2017   Jw Covin, PT  Haley Steele 10/25/2020, 10:14 AM  [facility] [place], [place], [address] Phone: [phone]   Fax:  [phone]  Name: Haley Steele MRN: [id number] Date of Birth: 25-Oct-1970

## 2020-10-25 NOTE — Patient Instructions (Signed)
Access Code: [email or web address] URL: [email or web address] Date: 10/25/2020 Prepared by: Isabelle Course  Exercises Shoulder External Rotation and Scapular Retraction - 2 x daily - 7 x weekly - 1 sets - 10 reps - 5 sec hold Seated Cervical Sidebending AROM - 2 x daily - 7 x weekly - 1 sets - 5 reps - 5-10 sec hold Supine Lower Trunk Rotation - 1 x daily - 7 x weekly - 2 sets - 10 reps Supine Bridge - 1 x daily - 7 x weekly - 2 sets - 10 reps Supine Shoulder Horizontal Abduction with Resistance - 1 x daily - 7 x weekly - 3 sets - 10 reps Standing Bilateral Low Shoulder Row with Anchored Resistance - 1 x daily - 7 x weekly - 3 sets - 10 reps Gentle Levator Scapulae Stretch - 1 x daily - 7 x weekly - 3 sets - 10 reps

## 2020-10-28 ENCOUNTER — Encounter: Payer: BC Managed Care – PPO | Admitting: Rehabilitative and Restorative Service Providers"

## 2020-10-28 ENCOUNTER — Telehealth: Payer: Self-pay | Admitting: Rehabilitative and Restorative Service Providers"

## 2020-10-28 NOTE — Telephone Encounter (Signed)
Cat was scheduled for PT appointment today and failed to show for appt. LM asking that she confirm appt scheduled for PT Wednesday, October 30, 2020.  Emori Mumme P. Helene Kelp PT, MPH 10/28/20 2:04 PM

## 2020-10-30 ENCOUNTER — Encounter: Payer: Self-pay | Admitting: Rehabilitative and Restorative Service Providers"

## 2020-11-05 ENCOUNTER — Other Ambulatory Visit: Payer: Self-pay

## 2020-11-05 ENCOUNTER — Encounter: Payer: Self-pay | Admitting: Physical Therapy

## 2020-11-05 ENCOUNTER — Ambulatory Visit (INDEPENDENT_AMBULATORY_CARE_PROVIDER_SITE_OTHER): Payer: BC Managed Care – PPO | Admitting: Physical Therapy

## 2020-11-05 DIAGNOSIS — G4486 Cervicogenic headache: Secondary | ICD-10-CM

## 2020-11-05 DIAGNOSIS — M546 Pain in thoracic spine: Secondary | ICD-10-CM | POA: Diagnosis not present

## 2020-11-05 DIAGNOSIS — R29898 Other symptoms and signs involving the musculoskeletal system: Secondary | ICD-10-CM | POA: Diagnosis not present

## 2020-11-05 DIAGNOSIS — M539 Dorsopathy, unspecified: Secondary | ICD-10-CM | POA: Diagnosis not present

## 2020-11-05 DIAGNOSIS — R293 Abnormal posture: Secondary | ICD-10-CM

## 2020-11-05 NOTE — Therapy (Addendum)
[place], [place], [address] Phone: [phone]   Fax:  [phone]  Physical Therapy Treatment and Discharge  Patient Details  Name: Haley Steele MRN: [id number] Date of Birth: 1971-02-05 Referring Provider (PT): Dr Emeterio Reeve   Encounter Date: 11/05/2020   PT End of Session - 11/05/20 1201    Visit Number 4    Number of Visits 12    Date for PT Re-Evaluation 11/25/20    PT Start Time 1156    PT Stop Time 1228    PT Time Calculation (min) 32 min    Activity Tolerance Patient tolerated treatment well    Behavior During Therapy [facility] for tasks assessed/performed           Past Medical History:  Diagnosis Date  . Diabetes mellitus without complication ([place])     History reviewed. No pertinent surgical history.  There were no vitals filed for this visit.   Subjective Assessment - 11/05/20 1159    Subjective Pt reports she hasn't taken any pain medication since Thursday.  She reports she is no longer limited with mobility.  "I even lifted (light) boxes today" without pain.    Patient Stated Goals get rid of pain    Currently in Pain? No/denies    Pain Score 0-No pain              [facility] PT Assessment - 11/05/20 0001      Assessment   Medical Diagnosis Cervial' thoracic, lumbar pain; Headache    Referring Provider (PT) Dr Emeterio Reeve    Onset Date/Surgical Date 10/06/20    Hand Dominance Right    Next MD Visit PRN    Prior Therapy none      Observation/Other Assessments   Focus on Therapeutic Outcomes (FOTO)  FS = 83      AROM   Cervical Flexion 50    Cervical Extension 55    Cervical - Right Side Bend 55    Cervical - Left Side Bend 56    Cervical - Right Rotation 63    Cervical - Left Rotation 70            [facility] Adult PT Treatment/Exercise - 11/05/20 0001      Neck Exercises: Seated   Cervical Rotation Right;Left;5 reps    Lateral Flexion  Right;Left;5 reps    W Back 10 reps    Other Seated Exercise horiz abdct bilat x 10, bilat shoulder ER x 10 - row x 10 - all with red.    Other Seated Exercise W's x 5 reps x 5 sec      Lumbar Exercises: Stretches   Lower Trunk Rotation 3 reps;10 seconds   arms in T   Other Lumbar Stretch Exercise open book thoracic rotation x 5 reps each side.      Lumbar Exercises: Aerobic   Nustep L5: arms/legs x 4 min for warm up.      Lumbar Exercises: Supine   Bridge 10 reps             PT Long Term Goals - 11/05/20 1202      PT LONG TERM GOAL #1   Title Improve posture and alignment with patient to demonstrate improved upright posture with decreased movement guarding and tightness    Time 6    Period Weeks    Status Achieved      PT LONG TERM GOAL #2   Title Increase cervical ROM by  5-10 deg in all planes pain free    Time 6    Period Weeks    Status Achieved      PT LONG TERM GOAL #3   Title Decrease cervical/thoracic/lumbar pain; headaches by 75% allowing patient to work and sleep with pain >/= 1-2/10 at most    Time 6    Period Weeks    Status Achieved      PT LONG TERM GOAL #4   Title Independent in HEP    Time 6    Period Weeks    Status Achieved      PT LONG TERM GOAL #5   Title Improve functional limitatioin score to 69    Time 6    Period Weeks    Status Achieved                 Plan - 11/05/20 1233    Clinical Impression Statement Pt arrived pain free.  Reviewed current HEP and added one stretch.  Pt has met all goals and verbalized desire to hold therapy 2 wks while she continues HEP.  Pt to call and schedule if she has flare up or has any questions.    PT Treatment/Interventions ADLs/Self Care Home Management;Aquatic Therapy;Cryotherapy;Electrical Stimulation;Iontophoresis 30m/ml Dexamethasone;Moist Heat;Ultrasound;Functional mobility training;Therapeutic activities;Therapeutic exercise;Balance training;Neuromuscular re-education;Patient/family  education;Manual techniques;Dry needling;Taping    PT Next Visit Plan hold until 4/12 - then d/c.    PT Home Exercise Plan [email or web address]    Consulted and Agree with Plan of Care Patient           Patient will benefit from skilled therapeutic intervention in order to improve the following deficits and impairments:  Decreased range of motion,Increased fascial restricitons,Impaired UE functional use,Pain,Impaired flexibility,Improper body mechanics,Decreased mobility,Impaired sensation,Postural dysfunction  Visit Diagnosis: Cervical dysfunction  Cervicogenic headache  Other symptoms and signs involving the musculoskeletal system  Pain in thoracic spine  Abnormal posture     Problem List Patient Active Problem List   Diagnosis Date Noted  . Abnormal Pap smear of cervix 11/21/2017  . Uterine leiomyoma 11/21/2017  . Headache 11/21/2017  . Tingling 11/04/2017  . Type 2 diabetes mellitus ([place] 03/23/2017   PHYSICAL THERAPY DISCHARGE SUMMARY  Visits from Start of Care: 4  Current functional level related to goals / functional outcomes: Decreased pain, improved ability to lift   Remaining deficits: See above   Education / Equipment: HEP Plan: Patient agrees to discharge.  Patient goals were met. Patient is being discharged due to meeting the stated rehab goals.  ?????    KIsabelle Course PT,DPT05/24/2212:01 PM   JKerin Perna PTA 11/05/20 12:37 PM  [address] [address] [place] [address]Phone: [phone]  Fax:  [phone] Name: Haley HadaMRN: [id number] of Birth: [birth year]

## 2020-11-07 ENCOUNTER — Encounter: Payer: BC Managed Care – PPO | Admitting: Rehabilitative and Restorative Service Providers"

## 2020-11-11 DIAGNOSIS — E119 Type 2 diabetes mellitus without complications: Secondary | ICD-10-CM | POA: Diagnosis not present

## 2020-11-11 LAB — HM DIABETES EYE EXAM

## 2020-11-13 ENCOUNTER — Encounter: Payer: BC Managed Care – PPO | Admitting: Rehabilitative and Restorative Service Providers"

## 2020-11-15 ENCOUNTER — Encounter: Payer: BC Managed Care – PPO | Admitting: Rehabilitative and Restorative Service Providers"

## 2020-12-10 ENCOUNTER — Other Ambulatory Visit: Payer: Self-pay

## 2020-12-10 ENCOUNTER — Encounter: Payer: Self-pay | Admitting: Osteopathic Medicine

## 2020-12-10 ENCOUNTER — Ambulatory Visit (INDEPENDENT_AMBULATORY_CARE_PROVIDER_SITE_OTHER): Payer: BC Managed Care – PPO | Admitting: Osteopathic Medicine

## 2020-12-10 VITALS — BP 136/84 | HR 82 | Temp 98.6°F | Wt 190.0 lb

## 2020-12-10 DIAGNOSIS — E1165 Type 2 diabetes mellitus with hyperglycemia: Secondary | ICD-10-CM | POA: Diagnosis not present

## 2020-12-10 LAB — POCT GLYCOSYLATED HEMOGLOBIN (HGB A1C): Hemoglobin A1C: 7.9 % — AB (ref 4.0–5.6)

## 2020-12-10 NOTE — Progress Notes (Signed)
Haley Steele is a 50 y.o. female who presents to  [place] at [facility]  today, 12/10/20, seeking care for the following:  DM2 follow-up   A1C today 7.9  Was 8.5 last visit 3 mos ago, was 9.6 at visit 6 mos ago   3 mos ago we started Ozempic 0.5 mg weekly,   Also on Jardiance 25 mg, Metfromin XR 1000 mg daily, Januvia 100 mg daily    Wt Readings from Last 3 Encounters:  12/10/20 190 lb (86.2 kg)  10/14/20 182 lb 1.3 oz (82.6 kg)  09/05/20 184 lb (83.5 kg)      ASSESSMENT & PLAN with other pertinent findings:  The encounter diagnosis was Type 2 diabetes mellitus with hyperglycemia, without long-term current use of insulin ([place]).   --> continue current Rx, work on diet/exercise    There are no Patient Instructions on file for this visit.  Orders Placed This Encounter  Procedures  . POCT HgB A1C    No orders of the defined types were placed in this encounter.    See below for relevant physical exam findings  See below for recent lab and imaging results reviewed  Medications, allergies, PMH, PSH, SocH, [place] reviewed below    Follow-up instructions: Return in about 4 months (around 04/12/2021) for [place] (call week prior to visit for lab orders).                                        Exam:  BP 136/84 (BP Location: Left Arm, Patient Position: Sitting, Cuff Size: Large)   Pulse 82   Temp 98.6 F (37 C) (Oral)   Wt 190 lb (86.2 kg)   BMI 32.61 kg/m   Constitutional: VS see above. General Appearance: alert, well-developed, well-nourished, NAD  Neck: No masses, trachea midline.   Respiratory: Normal respiratory effort.  Musculoskeletal: Gait normal. Symmetric and independent movement of all extremities  Neurological: Normal balance/coordination. No tremor.  Skin: warm, dry, intact.   Psychiatric: Normal judgment/insight. Normal mood and affect. Oriented x3.    Current Meds  Medication Sig  . atorvastatin (LIPITOR) 40 MG tablet Take 1 tablet (40 mg total) by mouth daily.  . blood glucose meter kit and supplies KIT Dispense based on patient and insurance preference. Use up to four times daily as directed. Please include 100 lancets, 100 test strips, control solution - all to refill x99 as needed  . Continuous Blood Gluc Receiver (FREESTYLE LIBRE 14 DAY READER) DEVI Use as directed  . Continuous Blood Gluc Sensor (FREESTYLE LIBRE 14 DAY SENSOR) MISC Use as directed  . empagliflozin (JARDIANCE) 25 MG TABS tablet Take 1 tablet (25 mg total) by mouth daily.  Marland Kitchen glucose blood test strip Use up to 4 times per day as directed with glucometer. Disp: 100. Refill x99  . lisinopril (ZESTRIL) 2.5 MG tablet Take 1 tablet (2.5 mg total) by mouth daily.  . metFORMIN (GLUCOPHAGE-XR) 500 MG 24 hr tablet Take 2 tablets (1,000 mg total) by mouth daily.  . methocarbamol (ROBAXIN) 500 MG tablet Take 1-2 tablets (500-1,000 mg total) by mouth every 8 (eight) hours as needed for muscle spasms.  . naproxen (NAPROSYN) 500 MG tablet Take 1 tablet (500 mg total) by mouth 2 (two) times daily with a meal.  . norethindrone (HEATHER) 0.35 MG tablet Take 1 tablet (0.35 mg total) by mouth daily.  Marland Kitchen  Semaglutide,0.25 or 0.5MG/DOS, (OZEMPIC, 0.25 OR 0.5 MG/DOSE,) 2 MG/1.5ML SOPN Inject 0.5 mg into the skin once a week.  . sitaGLIPtin (JANUVIA) 100 MG tablet Take 1 tablet (100 mg total) by mouth daily.    No Known Allergies  Patient Active Problem List   Diagnosis Date Noted  . Abnormal Pap smear of cervix 11/21/2017  . Uterine leiomyoma 11/21/2017  . Headache 11/21/2017  . Tingling 11/04/2017  . Type 2 diabetes mellitus ([place]) 03/23/2017    Family History  Problem Relation Age of Onset  . Cancer Mother 69       lung    Social History   Tobacco Use  Smoking Status Never Smoker  Smokeless Tobacco Never Used    No past surgical history on file.  Immunization History   Administered Date(s) Administered  . PFIZER(Purple Top)SARS-COV-2 Vaccination 11/03/2019, 11/24/2019, 05/28/2020    Recent Results (from the past 2160 hour(s))  POCT HgB A1C     Status: Abnormal   Collection Time: 12/10/20  3:01 PM  Result Value Ref Range   Hemoglobin A1C 7.9 (A) 4.0 - 5.6 %   HbA1c POC (<> result, manual entry)     HbA1c, POC (prediabetic range)     HbA1c, POC (controlled diabetic range)      No results found.     All questions at time of visit were answered - patient instructed to contact office with any additional concerns or updates. ER/RTC precautions were reviewed with the patient as applicable.   Please note: manual typing as well as voice recognition software may have been used to produce this document - typos may escape review. Please contact Dr. Sheppard Coil for any needed clarifications.

## 2021-02-07 DIAGNOSIS — Z1231 Encounter for screening mammogram for malignant neoplasm of breast: Secondary | ICD-10-CM | POA: Diagnosis not present

## 2021-02-07 LAB — HM MAMMOGRAPHY: HM Mammogram: NORMAL (ref 0–4)

## 2021-02-09 ENCOUNTER — Other Ambulatory Visit: Payer: Self-pay | Admitting: Osteopathic Medicine

## 2021-02-13 DIAGNOSIS — M9903 Segmental and somatic dysfunction of lumbar region: Secondary | ICD-10-CM | POA: Diagnosis not present

## 2021-02-13 DIAGNOSIS — M9905 Segmental and somatic dysfunction of pelvic region: Secondary | ICD-10-CM | POA: Diagnosis not present

## 2021-02-13 DIAGNOSIS — M62838 Other muscle spasm: Secondary | ICD-10-CM | POA: Diagnosis not present

## 2021-02-13 DIAGNOSIS — M9902 Segmental and somatic dysfunction of thoracic region: Secondary | ICD-10-CM | POA: Diagnosis not present

## 2021-04-21 ENCOUNTER — Ambulatory Visit (INDEPENDENT_AMBULATORY_CARE_PROVIDER_SITE_OTHER): Payer: BC Managed Care – PPO | Admitting: Osteopathic Medicine

## 2021-04-21 ENCOUNTER — Encounter: Payer: Self-pay | Admitting: Osteopathic Medicine

## 2021-04-21 VITALS — BP 126/72 | HR 83 | Temp 97.8°F | Ht 64.0 in | Wt 180.9 lb

## 2021-04-21 DIAGNOSIS — Z0000 Encounter for general adult medical examination without abnormal findings: Secondary | ICD-10-CM | POA: Diagnosis not present

## 2021-04-21 DIAGNOSIS — E1165 Type 2 diabetes mellitus with hyperglycemia: Secondary | ICD-10-CM

## 2021-04-21 NOTE — Progress Notes (Signed)
Haley Steele is a 50 y.o. female who presents to  [place] at [facility]  today, 04/21/21, seeking care for the following:  [place] with other pertinent findings:  The primary encounter diagnosis was Annual physical exam. A diagnosis of Type 2 diabetes mellitus with hyperglycemia, without long-term current use of insulin ([place]) was also pertinent to this visit.   Point-of-care A1c is reading higher than 14, patient does not have symptoms of polyuria/polydipsia, fatigue, unusual weight loss.  Will confirm with blood draw...  Patient Instructions  General Preventive Care Most recent routine screening labs: ordered today.  Blood pressure goal 130/80 or less.  Tobacco: don't!  Alcohol: responsible moderation is ok for most adults - if you have concerns about your alcohol intake, please talk to me!  Exercise: as tolerated to reduce risk of cardiovascular disease and diabetes. Strength training will also prevent osteoporosis.  Mental health: if need for mental health care (medicines, counseling, other), or concerns about moods, please let me know!  Sexual / Reproductive health: if need for STD testing, or if concerns with libido/pain problems, please let me know!  Advanced Directive: Living Will and/or Healthcare Power of Attorney recommended for all adults, regardless of age or health.  Vaccines Flu vaccine: for almost everyone, every fall.  Shingles vaccine: after age 20.  Pneumonia vaccines: after age 71, or sooner if certain medical conditions. Tetanus booster: every 10 years  COVID vaccine: THANKS for getting your vaccine! :) FALL BOOSTER STRONGLY RECOMMENDED  Cancer screenings  Colon cancer screening: for everyone age 73-75. Cologuard OR Colonosopy would be ok for you Breast cancer screening: mammogram annually after age 21.  Cervical cancer screening: Pap per OBGYN no later than 2023 based on  last results on file.  Lung cancer screening: not needed for non-smokers  Infection screenings  HIV: recommended screening at least once age 87-65, more often as needed. Gonorrhea/Chlamydia: screening as needed Hepatitis C: recommended once for everyone age 35-32 TB: certain at-risk populations, or depending on work requirements and/or travel history Other Bone Density Test: recommended for women at age 1  Orders Placed This Encounter  Procedures   CBC   COMPLETE METABOLIC PANEL WITH GFR   Lipid panel   Hemoglobin A1c   TSH   Urinalysis, Routine w reflex microscopic   Microalbumin / creatinine urine ratio   POCT HgB A1C    No orders of the defined types were placed in this encounter.    See below for relevant physical exam findings  See below for recent lab and imaging results reviewed  Medications, allergies, PMH, PSH, SocH, FamH reviewed below    Follow-up instructions: Return for RECHECK PENDING RESULTS / WILL NEED FOLLOW UP W/ JOY OR DR MATTHEWS IN 3 MOS .                                        Exam:  BP 126/72 (BP Location: Left Arm, Patient Position: Sitting, Cuff Size: Large)   Pulse 83   Temp 97.8 F (36.6 C)   Ht '5\' 4"'  (1.626 m)   Wt 180 lb 14.4 oz (82.1 kg)   SpO2 98%   BMI 31.05 kg/m  Constitutional: VS see above. General Appearance: alert, well-developed, well-nourished, NAD Neck: No masses, trachea midline.  Respiratory: Normal respiratory effort. no wheeze, no rhonchi,  no rales Cardiovascular: S1/S2 normal, no murmur, no rub/gallop auscultated. RRR.  Musculoskeletal: Gait normal. Symmetric and independent movement of all extremities Abdominal: non-tender, non-distended, no appreciable organomegaly, neg Murphy's, BS WNLx4 Neurological: Normal balance/coordination. No tremor. Skin: warm, dry, intact.  Psychiatric: Normal judgment/insight. Normal mood and affect. Oriented x3.   Current Meds  Medication Sig    atorvastatin (LIPITOR) 40 MG tablet Take 1 tablet (40 mg total) by mouth daily.   blood glucose meter kit and supplies KIT Dispense based on patient and insurance preference. Use up to four times daily as directed. Please include 100 lancets, 100 test strips, control solution - all to refill x99 as needed   Continuous Blood Gluc Receiver (FREESTYLE LIBRE 14 DAY READER) DEVI Use as directed   Continuous Blood Gluc Sensor (FREESTYLE LIBRE 14 DAY SENSOR) MISC Use as directed   empagliflozin (JARDIANCE) 25 MG TABS tablet Take 1 tablet (25 mg total) by mouth daily.   glucose blood test strip Use up to 4 times per day as directed with glucometer. Disp: 100. Refill x99   lisinopril (ZESTRIL) 2.5 MG tablet Take 1 tablet (2.5 mg total) by mouth daily.   metFORMIN (GLUCOPHAGE-XR) 500 MG 24 hr tablet Take 2 tablets (1,000 mg total) by mouth daily.   methocarbamol (ROBAXIN) 500 MG tablet Take 1-2 tablets (500-1,000 mg total) by mouth every 8 (eight) hours as needed for muscle spasms.   naproxen (NAPROSYN) 500 MG tablet TAKE 1 TABLET BY MOUTH 2 TIMES DAILY WITH A MEAL.   norethindrone (HEATHER) 0.35 MG tablet Take 1 tablet (0.35 mg total) by mouth daily.   Semaglutide,0.25 or 0.5MG/DOS, (OZEMPIC, 0.25 OR 0.5 MG/DOSE,) 2 MG/1.5ML SOPN Inject 0.5 mg into the skin once a week.   sitaGLIPtin (JANUVIA) 100 MG tablet Take 1 tablet (100 mg total) by mouth daily.    No Known Allergies  Patient Active Problem List   Diagnosis Date Noted   Abnormal Pap smear of cervix 11/21/2017   Uterine leiomyoma 11/21/2017   Headache 11/21/2017   Tingling 11/04/2017   Type 2 diabetes mellitus ([place]) 03/23/2017    Family History  Problem Relation Age of Onset   Cancer Mother 106       lung    Social History   Tobacco Use  Smoking Status Never  Smokeless Tobacco Never    History reviewed. No pertinent surgical history.  Immunization History  Administered Date(s) Administered   PFIZER(Purple Top)SARS-COV-2  Vaccination 11/03/2019, 11/24/2019, 05/28/2020    No results found for this or any previous visit (from the past 2160 hour(s)).  No results found.     All questions at time of visit were answered - patient instructed to contact office with any additional concerns or updates. ER/RTC precautions were reviewed with the patient as applicable.   Please note: manual typing as well as voice recognition software may have been used to produce this document - typos may escape review. Please contact Dr. Sheppard Coil for any needed clarifications.

## 2021-04-21 NOTE — Progress Notes (Signed)
Diabetic Eye Exam: 11/2020 - [facility] Mammogram: 02/2021 - [facility] [place]

## 2021-04-21 NOTE — Patient Instructions (Addendum)
General Preventive Care Most recent routine screening labs: ordered today.  Blood pressure goal 130/80 or less.  Tobacco: don't!  Alcohol: responsible moderation is ok for most adults - if you have concerns about your alcohol intake, please talk to me!  Exercise: as tolerated to reduce risk of cardiovascular disease and diabetes. Strength training will also prevent osteoporosis.  Mental health: if need for mental health care (medicines, counseling, other), or concerns about moods, please let me know!  Sexual / Reproductive health: if need for STD testing, or if concerns with libido/pain problems, please let me know!  Advanced Directive: Living Will and/or Healthcare Power of Attorney recommended for all adults, regardless of age or health.  Vaccines Flu vaccine: for almost everyone, every fall.  Shingles vaccine: after age 3.  Pneumonia vaccines: after age 73, or sooner if certain medical conditions. Tetanus booster: every 10 years  COVID vaccine: THANKS for getting your vaccine! :) FALL BOOSTER STRONGLY RECOMMENDED  Cancer screenings  Colon cancer screening: for everyone age 46-75. Cologuard OR Colonosopy would be ok for you Breast cancer screening: mammogram annually after age 42.  Cervical cancer screening: Pap per OBGYN no later than 2023 based on last results on file.  Lung cancer screening: not needed for non-smokers  Infection screenings  HIV: recommended screening at least once age 58-65, more often as needed. Gonorrhea/Chlamydia: screening as needed Hepatitis C: recommended once for everyone age 0000000 TB: certain at-risk populations, or depending on work requirements and/or travel history Other Bone Density Test: recommended for women at age 33

## 2021-04-22 LAB — POCT GLYCOSYLATED HEMOGLOBIN (HGB A1C): HbA1c, POC (controlled diabetic range): 14 % — AB (ref 0.0–7.0)

## 2021-04-25 LAB — COMPLETE METABOLIC PANEL WITH GFR
AG Ratio: 1.4 (calc) (ref 1.0–2.5)
ALT: 12 U/L (ref 6–29)
AST: 13 U/L (ref 10–35)
Albumin: 4.4 g/dL (ref 3.6–5.1)
Alkaline phosphatase (APISO): 90 U/L (ref 37–153)
BUN: 13 mg/dL (ref 7–25)
CO2: 27 mmol/L (ref 20–32)
Calcium: 10.1 mg/dL (ref 8.6–10.4)
Chloride: 100 mmol/L (ref 98–110)
Creat: 0.79 mg/dL (ref 0.50–1.03)
Globulin: 3.1 g/dL (calc) (ref 1.9–3.7)
Glucose, Bld: 235 mg/dL — ABNORMAL HIGH (ref 65–99)
Potassium: 4.3 mmol/L (ref 3.5–5.3)
Sodium: 138 mmol/L (ref 135–146)
Total Bilirubin: 0.5 mg/dL (ref 0.2–1.2)
Total Protein: 7.5 g/dL (ref 6.1–8.1)
eGFR: 91 mL/min/{1.73_m2} (ref >=60)

## 2021-04-25 LAB — URINALYSIS, ROUTINE W REFLEX MICROSCOPIC
Bilirubin Urine: NEGATIVE
Hgb urine dipstick: NEGATIVE
Leukocytes,Ua: NEGATIVE
Nitrite: NEGATIVE
Protein, ur: NEGATIVE
Specific Gravity, Urine: >=1.045 — AB (ref 1.001–1.035)
pH: <=5 (ref 5.0–8.0)

## 2021-04-25 LAB — CBC
HCT: 43.9 % (ref 35.0–45.0)
Hemoglobin: 14 g/dL (ref 11.7–15.5)
MCH: 29 pg (ref 27.0–33.0)
MCHC: 31.9 g/dL — ABNORMAL LOW (ref 32.0–36.0)
MCV: 91.1 fL (ref 80.0–100.0)
MPV: 12.8 fL — ABNORMAL HIGH (ref 7.5–12.5)
Platelets: 224 10*3/uL (ref 140–400)
RBC: 4.82 10*6/uL (ref 3.80–5.10)
RDW: 13.4 % (ref 11.0–15.0)
WBC: 5.9 10*3/uL (ref 3.8–10.8)

## 2021-04-25 LAB — LIPID PANEL
Cholesterol: 179 mg/dL (ref <200)
HDL: 51 mg/dL (ref >=50)
LDL Cholesterol (Calc): 108 mg/dL (calc) — ABNORMAL HIGH
Non-HDL Cholesterol (Calc): 128 mg/dL (calc) (ref <130)
Total CHOL/HDL Ratio: 3.5 (calc) (ref <5.0)
Triglycerides: 103 mg/dL (ref <150)

## 2021-04-25 LAB — TSH: TSH: 2.74 mIU/L

## 2021-04-25 LAB — MICROALBUMIN / CREATININE URINE RATIO
Creatinine, Urine: 71 mg/dL (ref 20–275)
Microalb Creat Ratio: 8 mcg/mg creat (ref <30)
Microalb, Ur: 0.6 mg/dL

## 2021-04-25 LAB — HEMOGLOBIN A1C: Hgb A1c MFr Bld: >14 % of total Hgb — ABNORMAL HIGH (ref <5.7)

## 2021-05-01 ENCOUNTER — Other Ambulatory Visit: Payer: Self-pay | Admitting: *Deleted

## 2021-05-01 MED ORDER — ATORVASTATIN CALCIUM 40 MG PO TABS: 40.0000 mg | ORAL_TABLET | Freq: Every day | ORAL | 0 refills | Status: DC

## 2021-05-01 MED ORDER — LISINOPRIL 2.5 MG PO TABS: 2.5000 mg | ORAL_TABLET | Freq: Every day | ORAL | 0 refills | Status: DC

## 2021-05-01 MED ORDER — EMPAGLIFLOZIN 25 MG PO TABS: 25.0000 mg | ORAL_TABLET | Freq: Every day | ORAL | 0 refills | Status: DC

## 2021-05-02 ENCOUNTER — Telehealth: Payer: Self-pay | Admitting: *Deleted

## 2021-05-02 ENCOUNTER — Other Ambulatory Visit: Payer: Self-pay | Admitting: Osteopathic Medicine

## 2021-05-07 ENCOUNTER — Other Ambulatory Visit: Payer: Self-pay | Admitting: Osteopathic Medicine

## 2021-05-08 ENCOUNTER — Telehealth (INDEPENDENT_AMBULATORY_CARE_PROVIDER_SITE_OTHER): Payer: BC Managed Care – PPO | Admitting: Family Medicine

## 2021-05-08 ENCOUNTER — Other Ambulatory Visit: Payer: Self-pay | Admitting: Family Medicine

## 2021-05-08 ENCOUNTER — Encounter: Payer: Self-pay | Admitting: Family Medicine

## 2021-05-08 DIAGNOSIS — E1165 Type 2 diabetes mellitus with hyperglycemia: Secondary | ICD-10-CM

## 2021-05-08 MED ORDER — OZEMPIC (0.25 OR 0.5 MG/DOSE) 2 MG/1.5ML ~~LOC~~ SOPN: 1.0000 mg | PEN_INJECTOR | SUBCUTANEOUS | 3 refills | Status: DC

## 2021-05-08 NOTE — Patient Instructions (Addendum)
-  Stop taking Januvia. -Increase your Ozempic to 1 mg every week (new prescription sent in with refills) -Continue all other medications as prescribed -Keep a log of fasting blood sugars each morning and bring with you to next appointment -Follow-up in 3 weeks - please call the office to schedule an in-person visit with Joy or Lovena Le -We will decide if insulin is needed at that time based on improving fasting blood sugars on current regimen -Diabetes education seminar this week - focus on diet and exercise. If you decide that you want a formal referral to the Diabetes and Nutrition team, please let us know.

## 2021-05-08 NOTE — Progress Notes (Signed)
Virtual Video Visit via MyChart Note  I connected with  Haley Steele on 05/08/21 at 10:30 AM EDT by the video enabled telemedicine application for MyChart, and verified that I am speaking with the correct person using two identifiers.   I introduced myself as a Designer, jewellery with the practice. We discussed the limitations of evaluation and management by telemedicine and the availability of in person appointments. The patient expressed understanding and agreed to proceed.  Participating parties in this visit include: The patient and the nurse practitioner listed.  The patient is: At home I am: In the office - [place]  Subjective:    CC:  Chief Complaint  Patient presents with   Medication Refill    Discuss new insulin medication.    HPI: Haley Steele is a 50 y.o. year old female presenting today via [place] today for diabetes follow-up.  At physical earlier this month, patient had POC HgbA1c >14% with repeat A1c via lab draw a few days later still showing >14%. At the physical she had discussed compliance and possible need for insulin with provider.   Patient reports that over the past several months she has been taking her Ozempic weekly as prescribed, but was only taking the oral daily meds maybe once per week. She also reports that she was traveling a lot during that time and not as careful with her eating habits. She was starting to become somewhat symptomatic with tingling in her fingers and toes.   After seeing her elevated A1c, she started taking her meds as prescribed and reports that her fasting sugars have started trending down. She forgets to check occasionally, but over the past week she states she has trended from 180 to 150 to 120 (yesterday morning). States she decided to sign up for a diabetes education seminar this week and will consider referral to diabetes and nutrition in the future. She has not had any other symptoms and no  hypoglycemic events.      Past medical history, Surgical history, Family history not pertinant except as noted below, Social history, Allergies, and medications have been entered into the medical record, reviewed, and corrections made.   Review of Systems:  All review of systems negative except what is listed in the HPI   Objective:    General:  Speaking clearly in complete sentences. Absent shortness of breath noted.   Alert and oriented x3.   Normal judgment.  Absent acute distress.   Impression and Recommendations:    1. Type 2 diabetes mellitus with hyperglycemia, without long-term current use of insulin (HCC) - Semaglutide,0.25 or 0.5MG /DOS, (OZEMPIC, 0.25 OR 0.5 MG/DOSE,) 2 MG/1.5ML SOPN; Inject 1 mg into the skin once a week. Inject 1 mg into the skin once a week.  Dispense: 7.5 mL; Refill: 3  Fasting sugars have started improving somewhat over the past week since she has been compliant with medications. -Stop taking Januvia - likely isn't making a significant impact, would rather increase Ozempic -Increase your Ozempic to 1 mg every week (new prescription sent in with refills) -Continue all other medications as prescribed!! -Keep a log of fasting blood sugars each morning and bring with you to next appointment -Follow-up in 3 weeks - please call the office to schedule an in-person visit with Joy or Lovena Le -We will decide if insulin is needed at that time based on improving fasting blood sugars on current/consistent regimen -Diabetes education seminar this week - focus on diet and exercise. If you decide that you  want a formal referral to the Diabetes and Nutrition team, please let us know.   Discussed case with Dr. Madilyn Fireman  Follow-up: 3 weeks or sooner if needed.    I discussed the assessment and treatment plan with the patient. The patient was provided an opportunity to ask questions and all were answered. The patient agreed with the plan and demonstrated an  understanding of the instructions.   The patient was advised to call back or seek an in-person evaluation if the symptoms worsen or if the condition fails to improve as anticipated.  I spent 20 minutes dedicated to the care of this patient on the date of this encounter to include pre-visit chart review of prior notes and results, face-to-face time with the patient, and post-visit ordering of testing as indicated.   Terrilyn Saver, NP

## 2021-05-08 NOTE — Progress Notes (Deleted)
Virtual Video Visit via MyChart Note  I connected with  Haley Steele on 05/08/21 at 10:30 AM EDT by the video enabled telemedicine application for MyChart, and verified that I am speaking with the correct person using two identifiers.   I introduced myself as a Designer, jewellery with the practice. We discussed the limitations of evaluation and management by telemedicine and the availability of in person appointments. The patient expressed understanding and agreed to proceed.  Participating parties in this visit include: The patient and the nurse practitioner listed.  The patient is: At home I am: In the office - [facility]  Subjective:    CC: No chief complaint on file.   HPI: Haley Steele is a 50 y.o. year old female presenting today via [place] today for ***.    Past medical history, Surgical history, Family history not pertinant except as noted below, Social history, Allergies, and medications have been entered into the medical record, reviewed, and corrections made.   Review of Systems:  All review of systems negative except what is listed in the HPI   Objective:    General:  Speaking clearly in complete sentences. {(BH) RANGE ABSENT/SEVERE:20013} shortness of breath noted.   Alert and oriented x3.   Normal judgment.  {ABSENTORPRESENT:304960286} acute distress.   Impression and Recommendations:    There are no diagnoses linked to this encounter.    Follow-up if symptoms worsen or fail to improve.    I discussed the assessment and treatment plan with the patient. The patient was provided an opportunity to ask questions and all were answered. The patient agreed with the plan and demonstrated an understanding of the instructions.   The patient was advised to call back or seek an in-person evaluation if the symptoms worsen or if the condition fails to improve as anticipated.  I spent *** minutes dedicated to the care of this patient  on the date of this encounter to include pre-visit chart review of prior notes and results, face-to-face time with the patient, and post-visit ordering of testing as indicated.   Terrilyn Saver, NP

## 2021-05-08 NOTE — Telephone Encounter (Signed)
Opened in error

## 2021-05-14 ENCOUNTER — Encounter: Payer: Self-pay | Admitting: Medical-Surgical

## 2021-05-14 DIAGNOSIS — E1165 Type 2 diabetes mellitus with hyperglycemia: Secondary | ICD-10-CM

## 2021-05-15 MED ORDER — OZEMPIC (1 MG/DOSE) 2 MG/1.5ML ~~LOC~~ SOPN: 1.0000 mg | PEN_INJECTOR | SUBCUTANEOUS | 11 refills | Status: DC

## 2021-06-03 ENCOUNTER — Other Ambulatory Visit: Payer: Self-pay | Admitting: Osteopathic Medicine

## 2021-06-12 ENCOUNTER — Other Ambulatory Visit: Payer: Self-pay

## 2021-06-12 ENCOUNTER — Other Ambulatory Visit (HOSPITAL_COMMUNITY)
Admission: RE | Admit: 2021-06-12 | Discharge: 2021-06-12 | Disposition: A | Discharge status: Home / Self Care | Payer: BC Managed Care – PPO | Source: Ambulatory Visit | Attending: Obstetrics and Gynecology | Admitting: Obstetrics and Gynecology

## 2021-06-12 ENCOUNTER — Encounter: Payer: Self-pay | Admitting: Obstetrics and Gynecology

## 2021-06-12 ENCOUNTER — Ambulatory Visit (INDEPENDENT_AMBULATORY_CARE_PROVIDER_SITE_OTHER): Payer: BC Managed Care – PPO | Admitting: Obstetrics and Gynecology

## 2021-06-12 VITALS — BP 152/87 | HR 79 | Resp 16 | Ht 64.0 in | Wt 182.0 lb

## 2021-06-12 DIAGNOSIS — Z124 Encounter for screening for malignant neoplasm of cervix: Secondary | ICD-10-CM | POA: Diagnosis not present

## 2021-06-12 DIAGNOSIS — Z01419 Encounter for gynecological examination (general) (routine) without abnormal findings: Secondary | ICD-10-CM | POA: Diagnosis not present

## 2021-06-12 DIAGNOSIS — R87619 Unspecified abnormal cytological findings in specimens from cervix uteri: Secondary | ICD-10-CM

## 2021-06-12 DIAGNOSIS — D259 Leiomyoma of uterus, unspecified: Secondary | ICD-10-CM

## 2021-06-12 MED ORDER — NORETHIN ACE-ETH ESTRAD-FE 1-20 MG-MCG PO TABS: 1.0000 | ORAL_TABLET | Freq: Every day | ORAL | 11 refills | Status: DC

## 2021-06-12 NOTE — Progress Notes (Signed)
GYNECOLOGY ANNUAL PREVENTATIVE CARE ENCOUNTER NOTE  History:     Haley Steele is a 50 y.o. G60 female here for a routine annual gynecologic exam.  She is sexually active and would like birth control.   Current complaints: None.     Denies abnormal vaginal bleeding, discharge, pelvic pain, problems with intercourse or other gynecologic concerns.     Gynecologic History Patient's last menstrual period was 06/03/2021. Contraception: none Last Pap: 2020. Result was normal Last Mammogram: 02/2021.  Result was normal. [facility] on [address] Last Colonoscopy: Not yet done, but she plans to go in the year  Obstetric History OB History  Gravida Para Term Preterm AB Living  0 0 0 0 0 0  SAB IAB Ectopic Multiple Live Births  0 0 0 0 0    Past Medical History:  Diagnosis Date   Diabetes mellitus without complication (HCC)    Fibroid    High cholesterol    Vaginal Pap smear, abnormal     History reviewed. No pertinent surgical history.  Current Outpatient Medications on File Prior to Visit  Medication Sig Dispense Refill   ACCU-CHEK AVIVA PLUS test strip USE AS DIRECTED FOR UP TO 4 TIMES DAILY 400 strip 5   atorvastatin (LIPITOR) 40 MG tablet TAKE 1 TABLET BY MOUTH EVERY DAY 90 tablet 0   blood glucose meter kit and supplies KIT Dispense based on patient and insurance preference. Use up to four times daily as directed. Please include 100 lancets, 100 test strips, control solution - all to refill x99 as needed 1 each 1   Continuous Blood Gluc Receiver (FREESTYLE LIBRE 14 DAY READER) DEVI Use as directed 1 each 99   Continuous Blood Gluc Sensor (FREESTYLE LIBRE 14 DAY SENSOR) MISC Use as directed 6 each 99   JARDIANCE 25 MG TABS tablet TAKE 1 TABLET (25 MG TOTAL) BY MOUTH DAILY. 90 tablet 0   lisinopril (ZESTRIL) 2.5 MG tablet TAKE 1 TABLET BY MOUTH EVERY DAY 90 tablet 0   metFORMIN (GLUCOPHAGE-XR) 500 MG 24 hr tablet TAKE 2 TABLETS (1,000 MG TOTAL) BY MOUTH DAILY.  180 tablet 0   naproxen (NAPROSYN) 500 MG tablet TAKE 1 TABLET BY MOUTH 2 TIMES DAILY WITH A MEAL. 30 tablet 3   Semaglutide, 1 MG/DOSE, (OZEMPIC, 1 MG/DOSE,) 2 MG/1.5ML SOPN Inject 1 mg into the skin once a week. 3 mL 11   No current facility-administered medications on file prior to visit.    No Known Allergies  Social History:  reports that she has never smoked. She has never used smokeless tobacco. She reports current alcohol use. She reports that she does not use drugs.  Family History  Problem Relation Age of Onset   Cancer Mother 52       lung    The following portions of the patient's history were reviewed and updated as appropriate: allergies, current medications, past family history, past medical history, past social history, past surgical history and problem list.  Review of Systems Pertinent items noted in HPI and remainder of comprehensive ROS otherwise negative.  Physical Exam:  BP (!) 150/87   Pulse 79   Resp 16   Ht '5\' 4"'  (1.626 m)   Wt 182 lb (82.6 kg)   LMP 06/03/2021   BMI 31.24 kg/m  CONSTITUTIONAL: Well-developed, well-nourished female in no acute distress.  HENT:  Normocephalic, atraumatic, External right and left ear normal.  EYES: Conjunctivae and EOM are normal. Pupils are equal, round, and reactive  to light. No scleral icterus.  NECK: Normal range of motion, supple, no masses.  Normal thyroid.  SKIN: Skin is warm and dry. No rash noted. Not diaphoretic. No erythema. No pallor. MUSCULOSKELETAL: Normal range of motion. No tenderness.  No cyanosis, clubbing, or edema. NEUROLOGIC: Alert and oriented to person, place, and time. Normal reflexes, muscle tone coordination.  PSYCHIATRIC: Normal mood and affect. Normal behavior. Normal judgment and thought content.  CARDIOVASCULAR: Normal heart rate noted, regular rhythm RESPIRATORY: Clear to auscultation bilaterally. Effort and breath sounds normal, no problems with respiration noted.  BREASTS: Symmetric in  size. No masses, tenderness, skin changes, nipple drainage, or lymphadenopathy bilaterally. Performed in the presence of a chaperone. ABDOMEN: Soft, no distention noted.  No tenderness, rebound or guarding.  PELVIC: External genitalia normal, Vagina normal without discharge, Urethra without abnormality or discharge, no bladder tenderness, cervix normal in appearance, no CMT, no adnexal masses or tenderness, uterus enlarged due to fibroids - fundus/fibroids to umbilicus. Performed in the presence of a chaperone.  Assessment and Plan:    1. Encounter for annual routine gynecological examination - Cervical cancer screening: Discussed guidelines. Pap with HPV done today - Breast Health: Encouraged self breast awareness/SBE. Discussed limits of clinical breast exam for detecting breast cancer. Discussed importance of annual MXR.  Rx not needed. She gets reminded and goes - Desires birth control - Discussed MEC guidelines and categories. We discussed the pros/cons of E containing birth control. She would prefer estrogen containing. Her only known diagnosis is well controlled T2DM without end organ damage. She takes lisinopril for prevention as well as the statin. She does not have CHTN or HL.  - Bone Health: Calcium via diet and supplementation. Discussed weight bearing exercise. DEXA due at age 28 - Colonoscopy:  Plans to go this year - F/U 12 months and prn   2. Uterine leiomyoma, unspecified location - in 2009 it was 8 cm with a smaller fibroid coming off of it that was 4 cm - In 2012, it was reduced in size to <6cm (report found in media tab) - Previously told if not bothering her, then no additional intervention needed. Reviewed that I agree especially in light of them not growing per her report. If changes in symptoms we can reeval but otherwise they should shrink some once she goes through menopause.   3. Abnormal cervical Papanicolaou smear, unspecified abnormal pap finding - Last pap in 2020  was normal, no HPV done and pap in 2018 was normal as well.  - No history of surgery    Routine preventative health maintenance measures emphasized. Please refer to After Visit Summary for other counseling recommendations.       Radene Gunning, MD, [facility], [place]

## 2021-06-12 NOTE — Addendum Note (Signed)
Addended by: Asencion Islam on: 06/12/2021 03:27 PM   Modules accepted: Orders

## 2021-06-16 LAB — CYTOLOGY - PAP
Comment: NEGATIVE
Diagnosis: NEGATIVE
High risk HPV: NEGATIVE

## 2021-07-23 ENCOUNTER — Other Ambulatory Visit: Payer: Self-pay

## 2021-07-23 ENCOUNTER — Ambulatory Visit (INDEPENDENT_AMBULATORY_CARE_PROVIDER_SITE_OTHER): Payer: BC Managed Care – PPO | Admitting: Medical-Surgical

## 2021-07-23 ENCOUNTER — Encounter: Payer: Self-pay | Admitting: Medical-Surgical

## 2021-07-23 VITALS — BP 118/87 | HR 95 | Resp 20 | Ht 64.0 in | Wt 181.2 lb

## 2021-07-23 DIAGNOSIS — E1165 Type 2 diabetes mellitus with hyperglycemia: Secondary | ICD-10-CM | POA: Diagnosis not present

## 2021-07-23 DIAGNOSIS — Z7689 Persons encountering health services in other specified circumstances: Secondary | ICD-10-CM | POA: Diagnosis not present

## 2021-07-23 DIAGNOSIS — Z1211 Encounter for screening for malignant neoplasm of colon: Secondary | ICD-10-CM

## 2021-07-23 LAB — POCT GLYCOSYLATED HEMOGLOBIN (HGB A1C): HbA1c, POC (controlled diabetic range): 10.3 % — AB (ref 0.0–7.0)

## 2021-07-23 MED ORDER — SEMAGLUTIDE (2 MG/DOSE) 8 MG/3ML ~~LOC~~ SOPN: 2.0000 mg | PEN_INJECTOR | SUBCUTANEOUS | 1 refills | Status: DC

## 2021-07-23 NOTE — Progress Notes (Signed)
°  HPI with pertinent ROS:   CC: DM follow up  HPI: Very pleasant 50 year old female presenting today for follow-up on diabetes.  Approximately 3 months ago she had her A1c checked with a result of greater than 14.0%.  She was started on semaglutide 1 mg weekly in addition to her prescription for metformin 1000 mg every morning and Jardiance 25 mg daily.  She has been taking her medications as prescribed and has been working to modify her diet.  She does have the freestyle libre for continuous glucose monitoring which is helpful.  She is not currently exercising but admits that she knows she should be.  Tolerating all of her medications well although she does note that the metformin causes some diarrhea after taking it every morning.  I reviewed the past medical history, family history, social history, surgical history, and allergies today and no changes were needed.  Please see the problem list section below in epic for further details.   Physical exam:   General: Well Developed, well nourished, and in no acute distress.  Neuro: Alert and oriented x3.  HEENT: Normocephalic, atraumatic.  Skin: Warm and dry. Cardiac: Regular rate and rhythm, no murmurs rubs or gallops, no lower extremity edema.  Respiratory: Clear to auscultation bilaterally. Not using accessory muscles, speaking in full sentences.  Impression and Recommendations:    1. Encounter to establish care Reviewed available information and discussed care concerns with patient.   2. Type 2 diabetes mellitus with hyperglycemia, without long-term current use of insulin (HCC) POCT hemoglobin A1c much improved at 10.3% today.  Continue metformin 1000 mg every morning.  Continue Jardiance 25 mg daily.  Increasing semaglutide to 2 mg weekly. - POCT HgB A1C  3. Screening for colon cancer Cologuard ordered. - Cologuard  Return in about 3 months (around 10/21/2021) for DM follow up. ___________________________________________ Clearnce Sorrel, DNP, APRN, FNP-BC [facility] [place]

## 2021-08-17 DIAGNOSIS — Z1211 Encounter for screening for malignant neoplasm of colon: Secondary | ICD-10-CM | POA: Diagnosis not present

## 2021-08-22 LAB — COLOGUARD: COLOGUARD: NEGATIVE

## 2021-10-17 ENCOUNTER — Other Ambulatory Visit: Payer: Self-pay | Admitting: Medical-Surgical

## 2021-10-21 ENCOUNTER — Ambulatory Visit: Payer: BC Managed Care – PPO | Admitting: Medical-Surgical

## 2021-11-10 NOTE — Progress Notes (Signed)
?  HPI with pertinent ROS:  ? ?CC: 3 month follow up ? ?HPI: ?Pleasant 51 year old female presenting today for the following: ? ?DM- checking sugars about 1 time weekly with average readings 119-120. Has cut back on sodas and is trying to choose healthier options. Taking Metformin and Jardiance as prescribed. Has been out of Ozempic for about 6 weeks due to insurance changes and coverage issues. Wants to get started back on that as soon as possible. Requesting test strip refills.  ? ?BP- not checking pressures at home. Taking Lisinopril 2.'5mg'$  daily, tolerating well without side effects. Denies CP, SOB, palpitations, lower extremity edema, dizziness, headaches, or vision changes. ? ?Birth control- has been taking Junel daily as prescribed. Treated long term for this due to dysmenorrhea. Takes the inactive pills every month and notes her period is usually fairly light without a lot of PMS symptoms. No missed doses. Requesting refills.  ? ?I reviewed the past medical history, family history, social history, surgical history, and allergies today and no changes were needed.  Please see the problem list section below in epic for further details. ? ? ?Physical exam:  ? ?General: Well Developed, well nourished, and in no acute distress.  ?Neuro: Alert and oriented x3.  ?HEENT: Normocephalic, atraumatic.  ?Skin: Warm and dry. ?Cardiac: Regular rate and rhythm, no murmurs rubs or gallops, no lower extremity edema.  ?Respiratory: Clear to auscultation bilaterally. Not using accessory muscles, speaking in full sentences. ? ?Impression and Recommendations:   ? ?1. Type 2 diabetes mellitus with hyperglycemia, without long-term current use of insulin ([place]) ?POCT A1c 8.1% down from 10.3% at last check. Checking labs. Continue monitoring sugars at home with goal of 120 or less. Continue Metformin and Jardiance as prescribed. Restarting Ozempic. Discussed restarting a high dose after so many weeks off of it. Recommended starting for a  few weeks at a lower dose and working back up to the '2mg'$  weekly. Patient had no symptoms from the '2mg'$  dose and preferred to restart at that dose. Verbalized understanding of potential side effects.  ?- CBC ?- COMPLETE METABOLIC PANEL WITH GFR ?- Lipid panel ?- POCT HgB A1C ? ?2. Elevated BP without diagnosis of hypertension ?BP elevated on arrival and again at recheck. Recommend she monitor BP at home/work with a goal of 130/80 or less. Limit dietary sodium. Continue Lisinopril 2.'5mg'$  daily as prescribed. If BP is running higher than goal, will need to adjust Lisinopril dose. I will send her a MyChart message in a couple of weeks to see how things are going.  ? ?3. Encounter for birth control pills maintenance ?Doing well and symptoms well controlled. Continue Junel Fe 1/20 daily as prescribed. Refills sent.  ? ?Return in about 3 months (around 02/10/2022) for DM follow up. ?___________________________________________ ?Clearnce Sorrel, DNP, APRN, FNP-BC ?[facility] ?[place] ?

## 2021-11-11 ENCOUNTER — Encounter: Payer: Self-pay | Admitting: Medical-Surgical

## 2021-11-11 ENCOUNTER — Ambulatory Visit (INDEPENDENT_AMBULATORY_CARE_PROVIDER_SITE_OTHER): Payer: BC Managed Care – PPO | Admitting: Medical-Surgical

## 2021-11-11 VITALS — BP 160/94 | HR 93 | Resp 20 | Ht 64.0 in | Wt 187.1 lb

## 2021-11-11 DIAGNOSIS — Z3041 Encounter for surveillance of contraceptive pills: Secondary | ICD-10-CM

## 2021-11-11 DIAGNOSIS — R030 Elevated blood-pressure reading, without diagnosis of hypertension: Secondary | ICD-10-CM

## 2021-11-11 DIAGNOSIS — E1165 Type 2 diabetes mellitus with hyperglycemia: Secondary | ICD-10-CM

## 2021-11-11 LAB — POCT GLYCOSYLATED HEMOGLOBIN (HGB A1C): HbA1c, POC (controlled diabetic range): 8.1 % — AB (ref 0.0–7.0)

## 2021-11-11 MED ORDER — ACCU-CHEK AVIVA PLUS VI STRP
ORAL_STRIP | 5 refills | Status: DC
Start: 2021-11-11 — End: 2021-11-13

## 2021-11-11 MED ORDER — NORETHIN ACE-ETH ESTRAD-FE 1-20 MG-MCG PO TABS: 1.0000 | ORAL_TABLET | Freq: Every day | ORAL | 11 refills | Status: DC

## 2021-11-11 MED ORDER — SEMAGLUTIDE (2 MG/DOSE) 8 MG/3ML ~~LOC~~ SOPN: 2.0000 mg | PEN_INJECTOR | SUBCUTANEOUS | 1 refills | Status: DC

## 2021-11-13 ENCOUNTER — Encounter: Payer: Self-pay | Admitting: Medical-Surgical

## 2021-11-13 ENCOUNTER — Telehealth: Payer: Self-pay

## 2021-11-13 ENCOUNTER — Other Ambulatory Visit: Payer: Self-pay | Admitting: *Deleted

## 2021-11-13 DIAGNOSIS — E1165 Type 2 diabetes mellitus with hyperglycemia: Secondary | ICD-10-CM

## 2021-11-13 MED ORDER — ACCU-CHEK AVIVA PLUS VI STRP: ORAL_STRIP | 5 refills | Status: DC

## 2021-11-13 NOTE — Telephone Encounter (Signed)
Initiated Prior authorization TUU:[id number] (2 MG/DOSE) '8MG'$ /3ML pen-injectors ?Via: Covermymeds ?Case/Key: [id number] ?Status: approved  as of 11/13/21 ?Reason:Effective from 11/13/2021 through 11/12/2022. ?Notified Pt via: Mychart ?

## 2021-11-14 LAB — COMPLETE METABOLIC PANEL WITH GFR
AG Ratio: 1.3 (calc) (ref 1.0–2.5)
ALT: 9 U/L (ref 6–29)
AST: 10 U/L (ref 10–35)
Albumin: 4 g/dL (ref 3.6–5.1)
Alkaline phosphatase (APISO): 72 U/L (ref 37–153)
BUN: 11 mg/dL (ref 7–25)
CO2: 23 mmol/L (ref 20–32)
Calcium: 9.5 mg/dL (ref 8.6–10.4)
Chloride: 100 mmol/L (ref 98–110)
Creat: 0.65 mg/dL (ref 0.50–1.03)
Globulin: 3.2 g/dL (calc) (ref 1.9–3.7)
Glucose, Bld: 345 mg/dL — ABNORMAL HIGH (ref 65–99)
Potassium: 4.1 mmol/L (ref 3.5–5.3)
Sodium: 133 mmol/L — ABNORMAL LOW (ref 135–146)
Total Bilirubin: 0.3 mg/dL (ref 0.2–1.2)
Total Protein: 7.2 g/dL (ref 6.1–8.1)
eGFR: 107 mL/min/{1.73_m2} (ref >=60)

## 2021-11-14 LAB — CBC
HCT: 39 % (ref 35.0–45.0)
Hemoglobin: 12.6 g/dL (ref 11.7–15.5)
MCH: 29.5 pg (ref 27.0–33.0)
MCHC: 32.3 g/dL (ref 32.0–36.0)
MCV: 91.3 fL (ref 80.0–100.0)
MPV: 12.5 fL (ref 7.5–12.5)
Platelets: 236 10*3/uL (ref 140–400)
RBC: 4.27 10*6/uL (ref 3.80–5.10)
RDW: 12.9 % (ref 11.0–15.0)
WBC: 4.9 10*3/uL (ref 3.8–10.8)

## 2021-11-14 LAB — LIPID PANEL
Cholesterol: 172 mg/dL (ref <200)
HDL: 46 mg/dL — ABNORMAL LOW (ref >=50)
LDL Cholesterol (Calc): 107 mg/dL (calc) — ABNORMAL HIGH
Non-HDL Cholesterol (Calc): 126 mg/dL (calc) (ref <130)
Total CHOL/HDL Ratio: 3.7 (calc) (ref <5.0)
Triglycerides: 101 mg/dL (ref <150)

## 2021-11-17 ENCOUNTER — Encounter: Payer: Self-pay | Admitting: Medical-Surgical

## 2021-11-18 ENCOUNTER — Other Ambulatory Visit: Payer: Self-pay | Admitting: Medical-Surgical

## 2021-11-18 NOTE — Telephone Encounter (Signed)
Do you have any PA's for this patient? Can you follow up with her and let her know.  ?

## 2021-11-24 ENCOUNTER — Telehealth: Payer: Self-pay

## 2021-11-24 ENCOUNTER — Encounter: Payer: Self-pay | Admitting: Medical-Surgical

## 2021-11-24 NOTE — Telephone Encounter (Addendum)
Initiated Prior authorization YOM:[id number] Aviva Plus strips ?Via: Covermymeds ?Case/Key:[id number] ?Status: approved  as of 11/24/21 ?Reason:Effective from 11/24/2021 through 11/23/2022. ? ?Pt has a high co-pay of $126, called pharmacy to confirm,advised pt to contact insurance to see what medications are covered with low co-pay ?Notified Pt via: Mychart, called pt left detailed vm ?

## 2021-12-04 LAB — HM DIABETES EYE EXAM

## 2021-12-09 ENCOUNTER — Encounter: Payer: Self-pay | Admitting: Obstetrics and Gynecology

## 2021-12-19 ENCOUNTER — Encounter: Payer: Self-pay | Admitting: Medical-Surgical

## 2022-02-09 DIAGNOSIS — Z1231 Encounter for screening mammogram for malignant neoplasm of breast: Secondary | ICD-10-CM | POA: Diagnosis not present

## 2022-02-09 LAB — HM MAMMOGRAPHY

## 2022-02-12 ENCOUNTER — Ambulatory Visit: Payer: BC Managed Care – PPO | Admitting: Medical-Surgical

## 2022-02-19 ENCOUNTER — Ambulatory Visit (INDEPENDENT_AMBULATORY_CARE_PROVIDER_SITE_OTHER): Payer: BC Managed Care – PPO | Admitting: Medical-Surgical

## 2022-02-19 ENCOUNTER — Encounter: Payer: Self-pay | Admitting: Medical-Surgical

## 2022-02-19 VITALS — BP 153/96 | HR 81 | Ht 64.0 in | Wt 186.0 lb

## 2022-02-19 DIAGNOSIS — E785 Hyperlipidemia, unspecified: Secondary | ICD-10-CM

## 2022-02-19 DIAGNOSIS — E1165 Type 2 diabetes mellitus with hyperglycemia: Secondary | ICD-10-CM

## 2022-02-19 DIAGNOSIS — R030 Elevated blood-pressure reading, without diagnosis of hypertension: Secondary | ICD-10-CM | POA: Insufficient documentation

## 2022-02-19 DIAGNOSIS — E1169 Type 2 diabetes mellitus with other specified complication: Secondary | ICD-10-CM | POA: Diagnosis not present

## 2022-02-19 LAB — POCT GLYCOSYLATED HEMOGLOBIN (HGB A1C): Hemoglobin A1C: 12.8 % — AB (ref 4.0–5.6)

## 2022-02-19 LAB — POCT UA - MICROALBUMIN
Creatinine, POC: 50 mg/dL
Microalbumin Ur, POC: 10 mg/L

## 2022-02-19 NOTE — Progress Notes (Signed)
Established Patient Office Visit  Subjective   Patient ID: Haley Steele, female   DOB: [birth year] Age: 51 y.o. MRN: [id number]   Chief Complaint  Patient presents with   Follow-up    HPI Pleasant 51 year old female presenting today to follow up on:  DM: checking occasionally, readings 130s when she does.  Has not been compliant with dosing of her Jardiance and metformin.  Notes the metformin causes significant GI upset and she has not taken that as prescribed because of it.  Also notes that she has not been compliant with her Vania Rea because she forgets the dosing.  Was unable to get Ozempic covered by her insurance without a large out-of-pocket cost.  HTN: Prescribed lisinopril 2.5 mg daily but has been noncompliant with this as well.  She has missed more days than she has taken it.  Not monitoring blood pressures regularly at home.  Hyperlipidemia: Prescribed atorvastatin 40 mg daily, also noncompliant with dosing.   Objective:    Vitals:   02/19/22 1347 02/19/22 1413  BP: (!) 150/115 (!) 153/96  Pulse: 81   Height: '5\' 4"'$  (1.626 m)   Weight: 186 lb 0.6 oz (84.4 kg)   SpO2: 98%   BMI (Calculated): 31.92    Physical Exam Vitals and nursing note reviewed.  Constitutional:      General: She is not in acute distress.    Appearance: Normal appearance. She is not ill-appearing.  HENT:     Head: Normocephalic and atraumatic.  Cardiovascular:     Rate and Rhythm: Normal rate and regular rhythm.     Pulses: Normal pulses.     Heart sounds: Normal heart sounds.  Pulmonary:     Effort: Pulmonary effort is normal. No respiratory distress.     Breath sounds: Normal breath sounds. No wheezing, rhonchi or rales.  Skin:    General: Skin is warm and dry.  Neurological:     Mental Status: She is alert and oriented to person, place, and time.  Psychiatric:        Mood and Affect: Mood normal.        Behavior: Behavior normal.        Thought Content: Thought content  normal.        Judgment: Judgment normal.     Results for orders placed or performed in visit on 02/19/22 (from the past 24 hour(s))  POCT HgB A1C     Status: Abnormal   Collection Time: 02/19/22  2:08 PM  Result Value Ref Range   Hemoglobin A1C 12.8 (A) 4.0 - 5.6 %   HbA1c POC (<> result, manual entry)     HbA1c, POC (prediabetic range)     HbA1c, POC (controlled diabetic range)    POCT UA - Microalbumin     Status: Abnormal   Collection Time: 02/19/22  2:09 PM  Result Value Ref Range   Microalbumin Ur, POC 10 mg/L   Creatinine, POC 50 mg/dL   Albumin/Creatinine Ratio, Urine, POC 30-300        The 10-year ASCVD risk score (Arnett DK, et al., 2019) is: 10.8%   Values used to calculate the score:     Age: 43 years     Sex: Female     Is Non-Hispanic African American: Yes     Diabetic: Yes     Tobacco smoker: No     Systolic Blood Pressure: 563 mmHg     Is BP treated: No     HDL Cholesterol: 46 mg/dL  Total Cholesterol: 172 mg/dL   Assessment & Plan:   1. Type 2 diabetes mellitus with hyperglycemia, without long-term current use of insulin (HCC) POCT hemoglobin A1c 12.8%.  Resume metformin 2 g twice daily as prescribed.  Resume Jardiance 25 mg daily.  Discount card provided for Ozempic since she has Pharmacist, community.  Advised her to contact her pharmacy regarding this and see if this will make the Ozempic more costly.  Strongly recommend working to be more compliant with medications and avoiding missed doses. - POCT HgB A1C - POCT UA - Microalbumin  2. Elevated blood pressure reading Blood pressure is elevated on arrival and again on recheck.  She has been noncompliant with dosing of lisinopril so recommend going home and making sure to take this every single day over the next 2 weeks.  Monitor blood pressure at home if the cuff is available with a goal of 130/80 or less.  Regular intentional exercise and low-sodium diet is always recommended.  Return in 2 weeks for  nurse visit for blood pressure check.  If necessary, we may need to increase her lisinopril dose  3. Hyperlipidemia associated with type 2 diabetes mellitus ([place]) Discussed compliance with daily medications.  Continue atorvastatin 40 mg daily.  Avoid missed doses.  Low-fat diet recommended.  Return in about 3 months (around 05/22/2022) for DM/HTN/HLD follow up. ___________________________________________ Clearnce Sorrel, DNP, APRN, FNP-BC [facility] [place]

## 2022-02-20 ENCOUNTER — Encounter: Payer: Self-pay | Admitting: Medical-Surgical

## 2022-02-20 MED ORDER — TOUJEO MAX SOLOSTAR 300 UNIT/ML ~~LOC~~ SOPN: 10.0000 [IU] | PEN_INJECTOR | Freq: Every day | SUBCUTANEOUS | 0 refills | Status: DC

## 2022-03-05 ENCOUNTER — Ambulatory Visit: Payer: BC Managed Care – PPO | Admitting: Medical-Surgical

## 2022-03-05 VITALS — BP 123/73 | HR 103

## 2022-03-05 DIAGNOSIS — R030 Elevated blood-pressure reading, without diagnosis of hypertension: Secondary | ICD-10-CM

## 2022-03-05 NOTE — Progress Notes (Signed)
   Established Patient Office Visit  Subjective   Patient ID: Brandee Markin, female    DOB: 01-11-1971  Age: 51 y.o. MRN: [id number]  Chief Complaint  Patient presents with   Hypertension    HPI  Bronwyn Belasco is here for blood pressure check. Denies chest pain, shortness of breath or dizziness.   ROS    Objective:     BP 123/73   Pulse (!) 103   SpO2 97%    Physical Exam   No results found for any visits on 03/05/22.    The 10-year ASCVD risk score (Arnett DK, et al., 2019) is: 4.8%    Assessment & Plan:  Blood pressure - Blood pressure within normal limits. Patient advised to continue current medications and follow up in 3 months with Joy.    Problem List Items Addressed This Visit       Unprioritized   Elevated blood pressure reading - Primary    Return in about 3 months (around 06/05/2022) for blood pressure check with Joy. Durene Romans, Monico Blitz, Bell Arthur

## 2022-03-05 NOTE — Progress Notes (Signed)
Agree with documentation as below.  ___________________________________________ Silvester Reierson L. Kinza Gouveia, DNP, APRN, FNP-BC Primary Care and Sports Medicine [facility]  

## 2022-03-13 ENCOUNTER — Encounter: Payer: Self-pay | Admitting: Medical-Surgical

## 2022-03-13 MED ORDER — PEN NEEDLES 31G X 8 MM MISC: 99 refills | Status: DC

## 2022-04-25 ENCOUNTER — Encounter: Payer: Self-pay | Admitting: Medical-Surgical

## 2022-05-14 IMAGING — DX DG CERVICAL SPINE COMPLETE 4+V
6 series · 6 of 6 positions shown · non-contrast
Comparison: None.

CLINICAL DATA: Cervical radicular symptoms on the left

EXAM:
CERVICAL SPINE - COMPLETE 4+ VIEW

[c-spine lat]
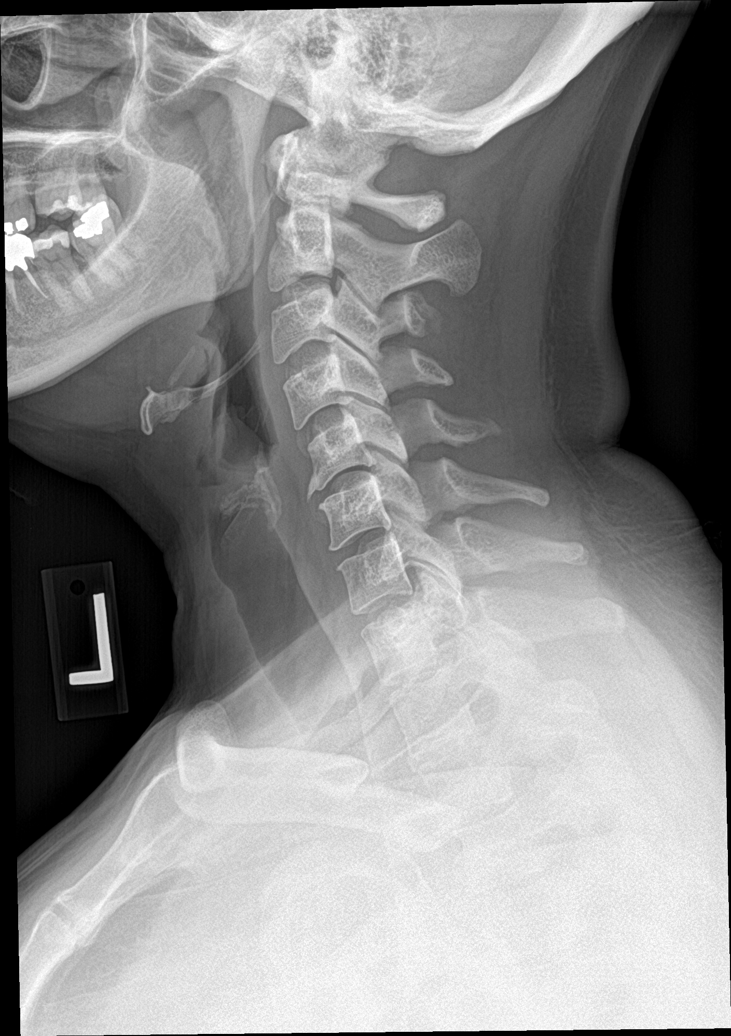

[c-spine obl (1 of 2)]
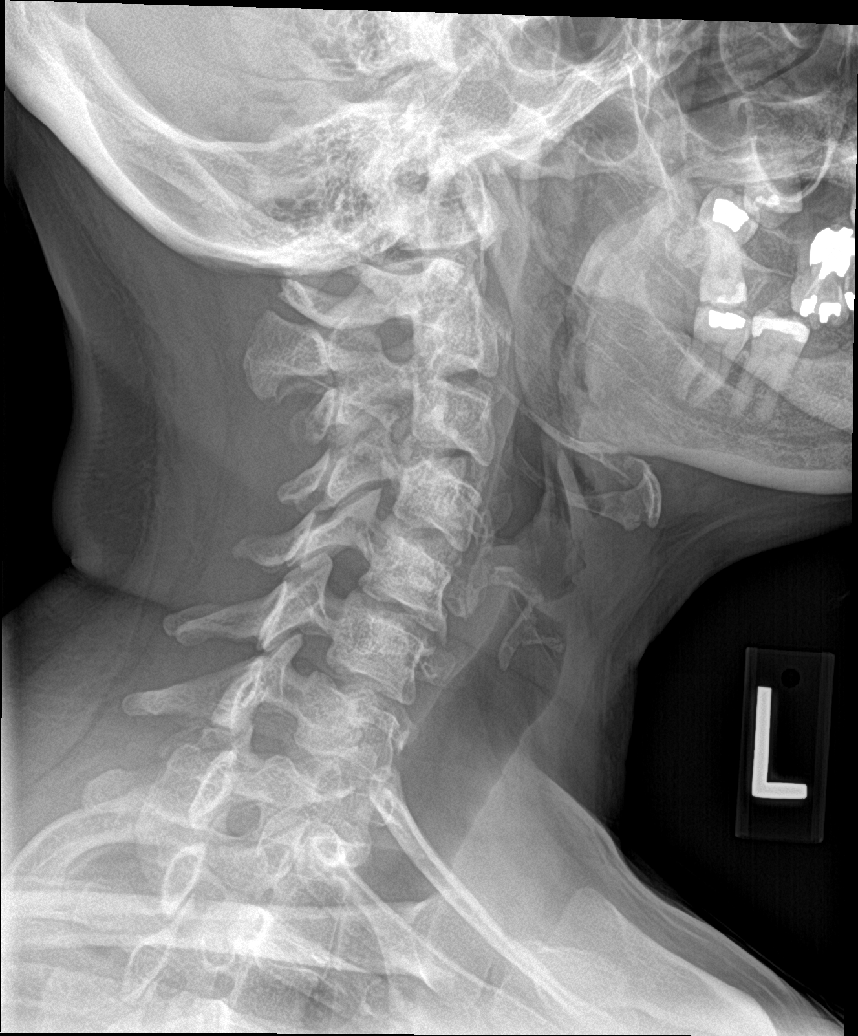

[c-spine obl (2 of 2)]
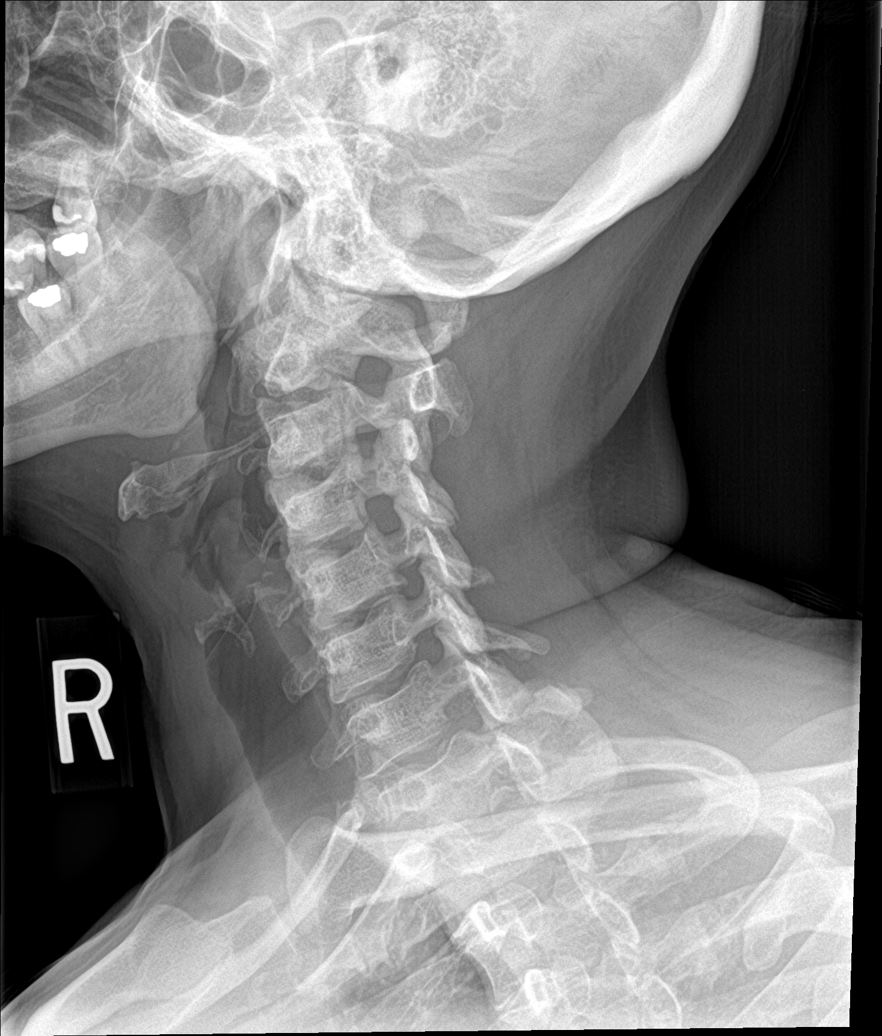

[c-spine ap]
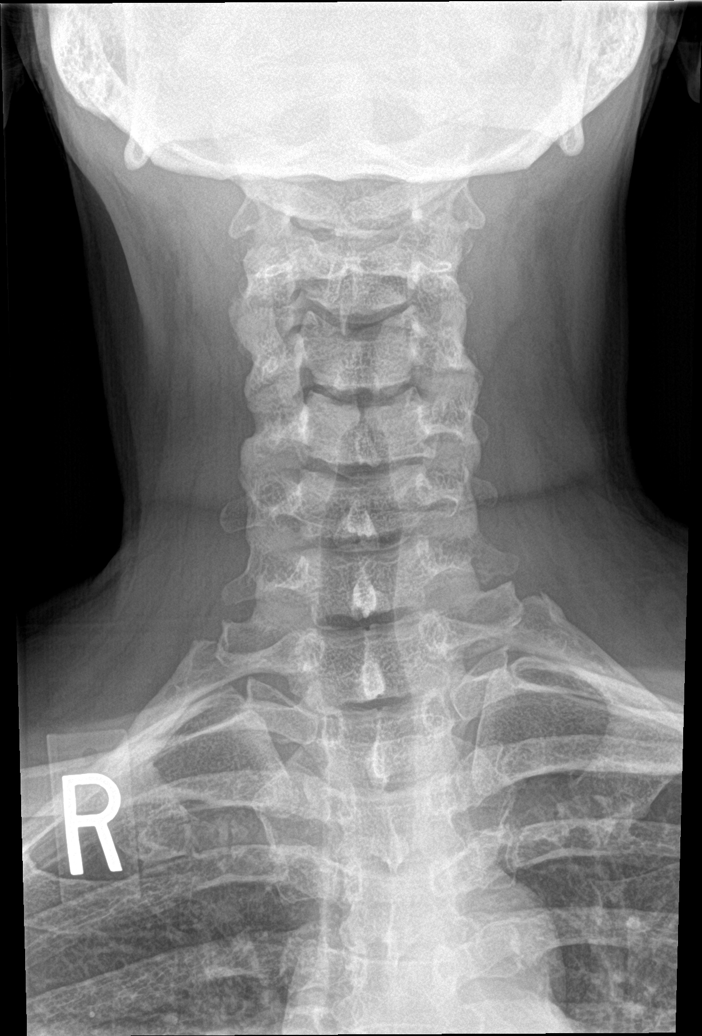

[c-spine open mouth]
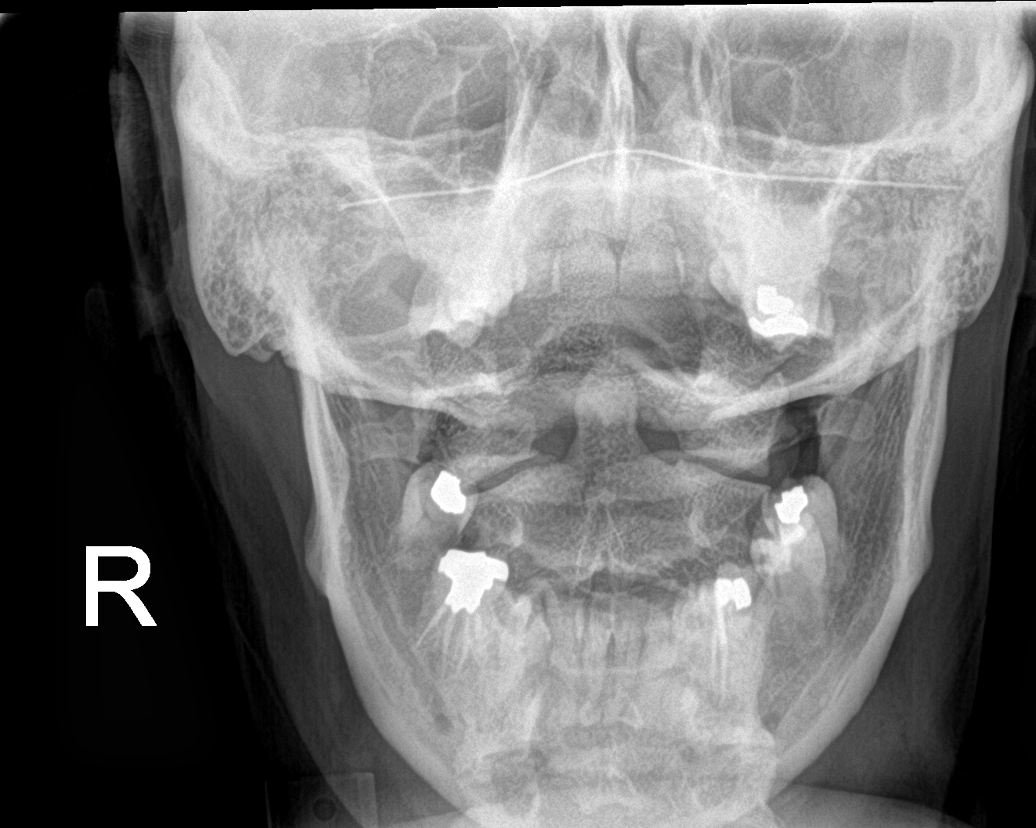

[c-spine swimmers]
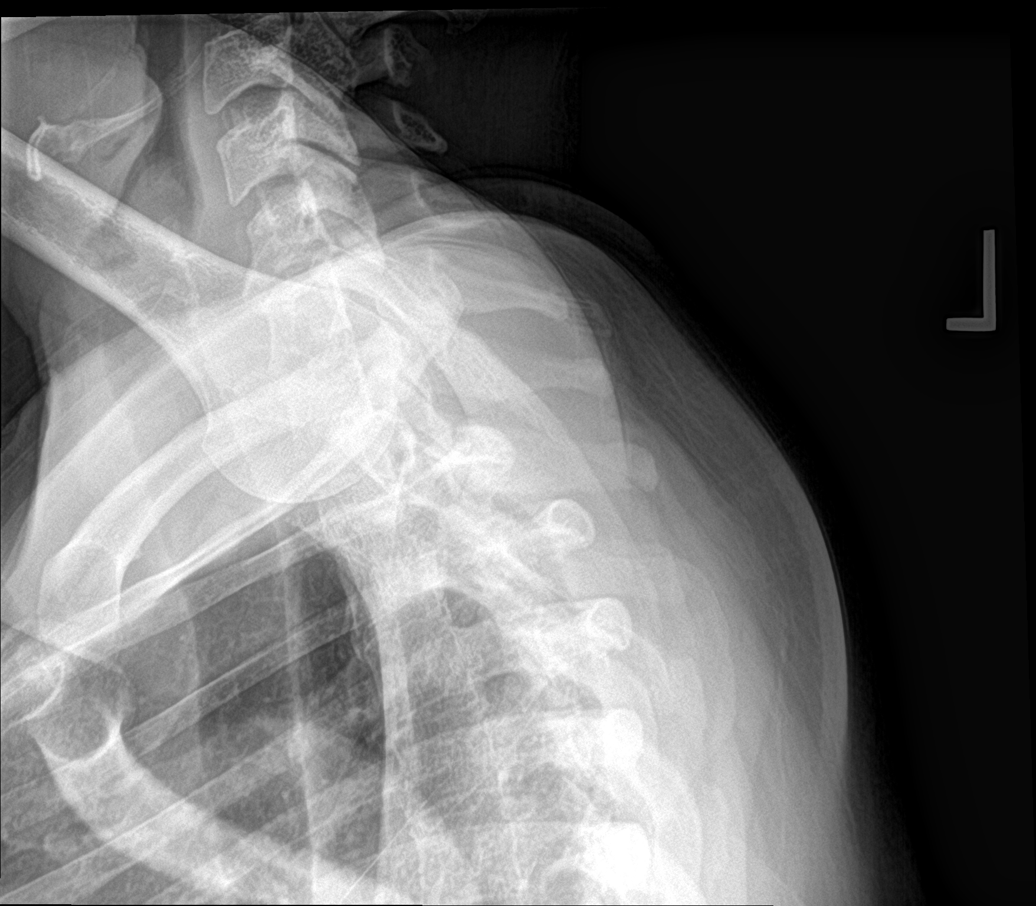

[6 of 6 positions shown; findings below may reference images not displayed]

FINDINGS: Spurring at C5-6 anteriorly. Disc spaces are maintained.
Uncovertebral spurring on the left at C5-6 with mild neural
foraminal narrowing. Normal alignment. No fracture. Prevertebral
soft tissues are normal.
IMPRESSION: Degenerative disc disease at C5-6 with uncovertebral spurring
causing mild left neural foraminal narrowing.

## 2022-05-28 ENCOUNTER — Ambulatory Visit: Payer: BC Managed Care – PPO | Admitting: Medical-Surgical

## 2022-06-22 ENCOUNTER — Encounter: Payer: Self-pay | Admitting: Medical-Surgical

## 2022-06-22 ENCOUNTER — Ambulatory Visit (INDEPENDENT_AMBULATORY_CARE_PROVIDER_SITE_OTHER): Payer: BC Managed Care – PPO | Admitting: Medical-Surgical

## 2022-06-22 VITALS — BP 143/87 | HR 83 | Resp 20 | Ht 64.0 in | Wt 185.7 lb

## 2022-06-22 DIAGNOSIS — E1165 Type 2 diabetes mellitus with hyperglycemia: Secondary | ICD-10-CM

## 2022-06-22 LAB — POCT GLYCOSYLATED HEMOGLOBIN (HGB A1C): HbA1c, POC (controlled diabetic range): 14 % — AB (ref 0.0–7.0)

## 2022-06-22 NOTE — Progress Notes (Signed)
Established Patient Office Visit  Subjective   Patient ID: Haley Steele, female   DOB: Aug 10, 1971 Age: 51 y.o. MRN: [id number]   Chief Complaint  Patient presents with   Hypertension   HPI Pleasant 51 year old female presenting for the following:  DM: taking Metformin '1000mg'$   and Jardiance '25mg'$  daily, tolerating well without side effects. Prescribed 10 units of Toujeo nightly but admit to only taking this a couple of nights a week. Reports that she feels unsure about using the Toujeo pen since she says it doesn't have numbers. Was going to bring it with her today but forgot it in the fridge. Plans to bring it by tomorrow or Wednesday so we can show her the correct way to dial it up to the right dose. Was previously prescribed Ozempic but has been off this for about a year since she could not afford it with her high deductible. She only checks her sugar once every couple of weeks and reports the numbers haven't been high. States they have not been more than 120 or so. Currently treated with Lisinopril and Atorvastatin.    Objective:    Vitals:   06/22/22 1541  BP: (!) 143/87  Pulse: 83  Resp: 20  Height: '5\' 4"'$  (1.626 m)  Weight: 185 lb 11.2 oz (84.2 kg)  SpO2: 100%  BMI (Calculated): 31.86    Physical Exam Vitals reviewed.  Constitutional:      General: She is not in acute distress.    Appearance: Normal appearance. She is obese. She is not ill-appearing.  HENT:     Head: Normocephalic and atraumatic.  Cardiovascular:     Rate and Rhythm: Normal rate and regular rhythm.     Pulses: Normal pulses.     Heart sounds: Normal heart sounds.  Pulmonary:     Effort: Pulmonary effort is normal. No respiratory distress.     Breath sounds: Normal breath sounds. No wheezing, rhonchi or rales.  Skin:    General: Skin is warm and dry.  Neurological:     Mental Status: She is alert and oriented to person, place, and time.  Psychiatric:        Mood and Affect: Mood normal.         Behavior: Behavior normal.        Thought Content: Thought content normal.        Judgment: Judgment normal.    Results for orders placed or performed in visit on 06/22/22 (from the past 24 hour(s))  POCT HgB A1C     Status: Abnormal   Collection Time: 06/22/22  4:16 PM  Result Value Ref Range   Hemoglobin A1C     HbA1c POC (<> result, manual entry)     HbA1c, POC (prediabetic range)     HbA1c, POC (controlled diabetic range) 14.0 (A) 0.0 - 7.0 %       The 10-year ASCVD risk score (Arnett DK, et al., 2019) is: 8.4%   Values used to calculate the score:     Age: 16 years     Sex: Female     Is Non-Hispanic African American: Yes     Diabetic: Yes     Tobacco smoker: No     Systolic Blood Pressure: 625 mmHg     Is BP treated: No     HDL Cholesterol: 46 mg/dL     Total Cholesterol: 172 mg/dL   Assessment & Plan:   1. Type 2 diabetes mellitus with hyperglycemia, without long-term current use of  insulin ([place]) POCT hgbA1c >14% today. Referring to Clinical Pharmacy Management for help with appropriate treatment regimen, patient education, and possible patient assistance forms. Samples of Ozempic provided today to restart at 0.'25mg'$  weekly for 4 weeks then increase to 0.'5mg'$  for 4-6 weeks to allow time for the referral to process. Continue Metformin '1000mg'$  daily and Jardiance '25mg'$  daily. Recommend closely monitoring glucose with a fasting goal of 120 or less. Continue Lisinopril and Atorvastatin as prescribed.  - POCT HgB A1C  Return in about 3 months (around 09/22/2022) for DM/HTN/HLD follow up.  ___________________________________________ Clearnce Sorrel, DNP, APRN, FNP-BC Primary Care and [place]

## 2022-06-28 ENCOUNTER — Emergency Department (HOSPITAL_COMMUNITY)
Admission: EM | Admit: 2022-06-28 | Discharge: 2022-06-28 | Disposition: A | Discharge status: Home / Self Care | Payer: BC Managed Care – PPO | Attending: Emergency Medicine | Admitting: Emergency Medicine

## 2022-06-28 ENCOUNTER — Other Ambulatory Visit: Payer: Self-pay

## 2022-06-28 ENCOUNTER — Emergency Department (HOSPITAL_COMMUNITY): Payer: BC Managed Care – PPO

## 2022-06-28 DIAGNOSIS — D259 Leiomyoma of uterus, unspecified: Secondary | ICD-10-CM | POA: Insufficient documentation

## 2022-06-28 DIAGNOSIS — E119 Type 2 diabetes mellitus without complications: Secondary | ICD-10-CM | POA: Diagnosis not present

## 2022-06-28 DIAGNOSIS — R1031 Right lower quadrant pain: Secondary | ICD-10-CM | POA: Diagnosis not present

## 2022-06-28 DIAGNOSIS — Z794 Long term (current) use of insulin: Secondary | ICD-10-CM | POA: Insufficient documentation

## 2022-06-28 DIAGNOSIS — R319 Hematuria, unspecified: Secondary | ICD-10-CM | POA: Diagnosis not present

## 2022-06-28 DIAGNOSIS — R140 Abdominal distension (gaseous): Secondary | ICD-10-CM | POA: Diagnosis not present

## 2022-06-28 DIAGNOSIS — Z7984 Long term (current) use of oral hypoglycemic drugs: Secondary | ICD-10-CM | POA: Diagnosis not present

## 2022-06-28 DIAGNOSIS — D219 Benign neoplasm of connective and other soft tissue, unspecified: Secondary | ICD-10-CM

## 2022-06-28 DIAGNOSIS — K760 Fatty (change of) liver, not elsewhere classified: Secondary | ICD-10-CM | POA: Diagnosis not present

## 2022-06-28 LAB — CBC WITH DIFFERENTIAL/PLATELET
Abs Immature Granulocytes: 0.07 10*3/uL (ref 0.00–0.07)
Basophils Absolute: 0 10*3/uL (ref 0.0–0.1)
Basophils Relative: 0 %
Eosinophils Absolute: 0 10*3/uL (ref 0.0–0.5)
Eosinophils Relative: 0 %
HCT: 43.6 % (ref 36.0–46.0)
Hemoglobin: 14.1 g/dL (ref 12.0–15.0)
Immature Granulocytes: 1 %
Lymphocytes Relative: 15 %
Lymphs Abs: 2 10*3/uL (ref 0.7–4.0)
MCH: 29.7 pg (ref 26.0–34.0)
MCHC: 32.3 g/dL (ref 30.0–36.0)
MCV: 92 fL (ref 80.0–100.0)
Monocytes Absolute: 0.9 10*3/uL (ref 0.1–1.0)
Monocytes Relative: 7 %
Neutro Abs: 10.8 10*3/uL — ABNORMAL HIGH (ref 1.7–7.7)
Neutrophils Relative %: 77 %
Platelets: 231 10*3/uL (ref 150–400)
RBC: 4.74 MIL/uL (ref 3.87–5.11)
RDW: 13.3 % (ref 11.5–15.5)
WBC: 13.8 10*3/uL — ABNORMAL HIGH (ref 4.0–10.5)
nRBC: 0 % (ref 0.0–0.2)

## 2022-06-28 LAB — COMPREHENSIVE METABOLIC PANEL
ALT: 14 U/L (ref 0–44)
AST: 32 U/L (ref 15–41)
Albumin: 4.1 g/dL (ref 3.5–5.0)
Alkaline Phosphatase: 85 U/L (ref 38–126)
Anion gap: 16 — ABNORMAL HIGH (ref 5–15)
BUN: 14 mg/dL (ref 6–20)
CO2: 15 mmol/L — ABNORMAL LOW (ref 22–32)
Calcium: 9.4 mg/dL (ref 8.9–10.3)
Chloride: 103 mmol/L (ref 98–111)
Creatinine, Ser: 0.85 mg/dL (ref 0.44–1.00)
GFR, Estimated: >60 mL/min (ref >60)
Glucose, Bld: 242 mg/dL — ABNORMAL HIGH (ref 70–99)
Potassium: 4.4 mmol/L (ref 3.5–5.1)
Sodium: 134 mmol/L — ABNORMAL LOW (ref 135–145)
Total Bilirubin: 1.6 mg/dL — ABNORMAL HIGH (ref 0.3–1.2)
Total Protein: 8.6 g/dL — ABNORMAL HIGH (ref 6.5–8.1)

## 2022-06-28 LAB — URINALYSIS, ROUTINE W REFLEX MICROSCOPIC
Bacteria, UA: NONE SEEN
Bilirubin Urine: NEGATIVE
Glucose, UA: >=500 mg/dL — AB
Ketones, ur: 80 mg/dL — AB
Leukocytes,Ua: NEGATIVE
Nitrite: NEGATIVE
Protein, ur: 30 mg/dL — AB
Specific Gravity, Urine: 1.035 — ABNORMAL HIGH (ref 1.005–1.030)
pH: 5 (ref 5.0–8.0)

## 2022-06-28 LAB — PREGNANCY, URINE: Preg Test, Ur: NEGATIVE

## 2022-06-28 LAB — LIPASE, BLOOD: Lipase: 26 U/L (ref 11–51)

## 2022-06-28 MED ORDER — KETOROLAC TROMETHAMINE 15 MG/ML IJ SOLN
15.0000 mg | Freq: Once | INTRAMUSCULAR | Status: AC
  Administered 2022-06-28: 15 mg via INTRAVENOUS
  Filled 2022-06-28: qty 1

## 2022-06-28 MED ORDER — KETOROLAC TROMETHAMINE 10 MG PO TABS: 10.0000 mg | ORAL_TABLET | Freq: Four times a day (QID) | ORAL | 0 refills | Status: DC | PRN

## 2022-06-28 MED ORDER — SODIUM CHLORIDE 0.9 % IV BOLUS
1000.0000 mL | Freq: Once | INTRAVENOUS | Status: AC
  Administered 2022-06-28: 1000 mL via INTRAVENOUS

## 2022-06-28 MED ORDER — IOHEXOL 300 MG/ML  SOLN
100.0000 mL | Freq: Once | INTRAMUSCULAR | Status: AC | PRN
  Administered 2022-06-28: 100 mL via INTRAVENOUS

## 2022-06-28 NOTE — ED Provider Notes (Signed)
[facility] DEPT Provider Note   CSN: [id number] Arrival date & time: 06/28/22  1415     History {Add pertinent medical, surgical, social history, OB history to HPI:1} Chief Complaint  Patient presents with   Abdominal Pain    Haley Steele is a 51 y.o. female with a past medical history of diabetes presenting to the emergency department for evaluation of right lower quadrant abdominal pain for 2 days.  Patient states the pain radiates to her groin and her R leg.  The pain is sharp, intermittent, worse with movement.  She has tried Tylenol and ibuprofen with no relief.  Reports hematuria this morning.  Denies chest pain, shortness of breath, nausea, vomiting, fever, bowel changes, rash.    Abdominal Pain      Home Medications Prior to Admission medications   Medication Sig Start Date End Date Taking? Authorizing Provider  ketorolac (TORADOL) 10 MG tablet Take 1 tablet (10 mg total) by mouth every 6 (six) hours as needed. 06/28/22  Yes Rex Kras, PA  atorvastatin (LIPITOR) 40 MG tablet TAKE 1 TABLET BY MOUTH EVERY DAY 10/17/21   Samuel Bouche, NP  blood glucose meter kit and supplies KIT Dispense based on patient and insurance preference. Use up to four times daily as directed. Please include 100 lancets, 100 test strips, control solution - all to refill x99 as needed 01/13/18   Emeterio Reeve, DO  Continuous Blood Gluc Receiver (FREESTYLE LIBRE 14 DAY READER) DEVI Use as directed 07/03/20   Emeterio Reeve, DO  Continuous Blood Gluc Sensor (FREESTYLE LIBRE 14 DAY SENSOR) MISC Use as directed 05/14/20   Emeterio Reeve, DO  empagliflozin (JARDIANCE) 25 MG TABS tablet TAKE 1 TABLET (25 MG TOTAL) BY MOUTH DAILY. 10/17/21   Samuel Bouche, NP  glucose blood (ACCU-CHEK AVIVA PLUS) test strip USE AS DIRECTED FOR UP TO 4 TIMES DAILY. DX: E11.65 11/13/21   Samuel Bouche, NP  insulin glargine, 2 Unit Dial, (TOUJEO MAX SOLOSTAR) 300 UNIT/ML Solostar Pen Inject  10 Units into the skin daily. Titrate up by 2 units daily for a goal of fasting sugars of less than 130.  Maximum 50 units daily. 02/20/22   Samuel Bouche, NP  Insulin Pen Needle (PEN NEEDLES) 31G X 8 MM MISC Inject into skin once daily. 03/13/22   Samuel Bouche, NP  lisinopril (ZESTRIL) 2.5 MG tablet TAKE 1 TABLET BY MOUTH EVERY DAY 10/17/21   Samuel Bouche, NP  metFORMIN (GLUCOPHAGE-XR) 500 MG 24 hr tablet TAKE 2 TABLETS (1,000 MG TOTAL) BY MOUTH DAILY. 05/05/21   Silverio Decamp, MD  norethindrone-ethinyl estradiol-FE (JUNEL FE 1/20) 1-20 MG-MCG tablet Take 1 tablet by mouth daily. 11/11/21   Samuel Bouche, NP  Semaglutide, 2 MG/DOSE, 8 MG/3ML SOPN Inject 2 mg as directed once a week. 11/11/21   Samuel Bouche, NP      Allergies    Patient has no known allergies.    Review of Systems   Review of Systems  Gastrointestinal:  Positive for abdominal pain.    Physical Exam Updated Vital Signs BP 128/77   Pulse (!) 102   Temp 98.1 F (36.7 C)   Resp 19   SpO2 97%  Physical Exam Vitals and nursing note reviewed.  Constitutional:      Appearance: Normal appearance.  HENT:     Head: Normocephalic and atraumatic.     Mouth/Throat:     Mouth: Mucous membranes are moist.  Eyes:     General: No scleral icterus. Cardiovascular:  Rate and Rhythm: Normal rate and regular rhythm.     Pulses: Normal pulses.     Heart sounds: Normal heart sounds.  Pulmonary:     Effort: Pulmonary effort is normal.     Breath sounds: Normal breath sounds.  Abdominal:     General: Abdomen is flat. There is distension.     Palpations: Abdomen is soft.     Tenderness: There is abdominal tenderness in the right lower quadrant.  Musculoskeletal:        General: No deformity.  Skin:    General: Skin is warm.     Findings: No rash.  Neurological:     General: No focal deficit present.     Mental Status: She is alert.  Psychiatric:        Mood and Affect: Mood normal.     ED Results / Procedures / Treatments    Labs (all labs ordered are listed, but only abnormal results are displayed) Labs Reviewed  CBC WITH DIFFERENTIAL/PLATELET - Abnormal; Notable for the following components:      Result Value   WBC 13.8 (*)    Neutro Abs 10.8 (*)    All other components within normal limits  COMPREHENSIVE METABOLIC PANEL - Abnormal; Notable for the following components:   Sodium 134 (*)    CO2 15 (*)    Glucose, Bld 242 (*)    Total Protein 8.6 (*)    Total Bilirubin 1.6 (*)    Anion gap 16 (*)    All other components within normal limits  URINALYSIS, ROUTINE W REFLEX MICROSCOPIC - Abnormal; Notable for the following components:   Specific Gravity, Urine 1.035 (*)    Glucose, UA >=500 (*)    Hgb urine dipstick LARGE (*)    Ketones, ur 80 (*)    Protein, ur 30 (*)    All other components within normal limits  LIPASE, BLOOD  PREGNANCY, URINE    EKG None  Radiology CT ABDOMEN PELVIS W CONTRAST  Result Date: 06/28/2022 CLINICAL DATA:  Right lower quadrant pain. EXAM: CT ABDOMEN AND PELVIS WITH CONTRAST TECHNIQUE: Multidetector CT imaging of the abdomen and pelvis was performed using the standard protocol following bolus administration of intravenous contrast. RADIATION DOSE REDUCTION: This exam was performed according to the departmental dose-optimization program which includes automated exposure control, adjustment of the mA and/or kV according to patient size and/or use of iterative reconstruction technique. CONTRAST:  185m OMNIPAQUE IOHEXOL 300 MG/ML  SOLN COMPARISON:  None Available. FINDINGS: Lower chest: No acute abnormality. Hepatobiliary: Hepatic steatosis. The liver, portal vein, and gallbladder is otherwise normal. Pancreas: Unremarkable. No pancreatic ductal dilatation or surrounding inflammatory changes. Spleen: Normal in size without focal abnormality. Adrenals/Urinary Tract: Adrenal glands are unremarkable. Kidneys are normal, without renal calculi, focal lesion, or hydronephrosis.  Bladder is unremarkable. Stomach/Bowel: The stomach and small bowel are normal. The appendix is normal. Vascular/Lymphatic: No significant vascular findings are present. No enlarged abdominal or pelvic lymph nodes. Reproductive: The uterus is enlarged, extending into the abdomen above the level the umbilicus. The uterus contains multiple fibroids. The largest is located within the uterus itself measuring at least 16 cm in cranial caudal dimension. Two pedunculated fibroids arise from the fundus measuring 9 and 4 cm. There appear to be a since smaller fibroids within the uterus as well. The left fallopian tube and ovary are unremarkable. There is a 2 cm simple cyst in the left ovary. The right ovary is more prominent than the left and fluid  in density, likely mild hydrosalpinx. No ovarian/adnexal mass. Other: No free air or free fluid. There is a fat containing umbilical hernia. Musculoskeletal: No acute or significant osseous findings. IMPRESSION: 1. The uterus is enlarged and extends into the abdomen above the level of the umbilicus. The uterus contains multiple fibroids, the largest of which measures at least 16 cm in cranial caudal dimension. 2. Probable mild right hydrosalpinx. 3. 2 cm left ovarian simple-appearing cyst. No follow-up imaging is recommended.Reference: JACR 2020 Feb;17(2):248-254 4. Hepatic steatosis. 5. No other abnormalities. Electronically Signed   By: Dorise Bullion III M.D.   On: 06/28/2022 17:16    Procedures Procedures  {Document cardiac monitor, telemetry assessment procedure when appropriate:1}  Medications Ordered in ED Medications  ketorolac (TORADOL) 15 MG/ML injection 15 mg (15 mg Intravenous Given 06/28/22 1602)  iohexol (OMNIPAQUE) 300 MG/ML solution 100 mL (100 mLs Intravenous Contrast Given 06/28/22 1645)  sodium chloride 0.9 % bolus 1,000 mL (1,000 mLs Intravenous New Bag/Given 06/28/22 1839)    ED Course/ Medical Decision Making/ A&P                            Medical Decision Making Amount and/or Complexity of Data Reviewed Labs: ordered. Radiology: ordered.  Risk Prescription drug management.   This patient presents to the ED for right lower abdominal pain, this involves an extensive number of treatment options, and is a complaint that carries with a high risk of complications and morbidity.  The differential diagnosis includes ovarian torsion, ectopic pregnancy, kidney stone, diverticulitis, appendicitis, kidney stone, UTI, infectious etiology.  This is not an exhaustive list.  Comorbidities that complicate the patient evaluation See HPI  Social determinants of health NA  Additional history obtained: Additional history obtained from EMR. External records from outside source obtained and review including prior labs  Cardiac monitoring/EKG: The patient was maintained on a cardiac monitor.  I personally reviewed and interpreted the cardiac monitor which showed an underlying rhythm of: Sinus rhythm.  Lab tests: I ordered and personally interpreted labs.  The pertinent results include: WBC 13.8. Hbg unremarkable. Platelets unremarkable. No electrolyte abnormalities noted. BUN, creatinine unremarkable. LFT unremarkable. UA significant for no acute abnormality.  Imaging studies: I ordered imaging studies which showed 1. The uterus is enlarged and extends into the abdomen above the level of the umbilicus. The uterus contains multiple fibroids, the largest of which measures at least 16 cm in cranial caudal dimension. 2. Probable mild right hydrosalpinx. 3. 2 cm left ovarian simple-appearing cyst. No follow-up imaging is recommended.. I personally reviewed, interpreted imaging and agree with the radiologist's interpretations.  Problem list/ ED course/ Critical interventions/ Medical management: HPI: See above Vital signs heart rate 112 otherwise within normal range and stable throughout visit. Laboratory/imaging studies significant for: See  above. On physical examination, patient is afebrile and appears in no acute distress.  There was tenderness ovation to the right lower quadrant.  The abdomen seems to be distended which patient states has not changed in the last few years.  Patient known to have a fibroid.  States the pain goes from right lower quadrant to groin.  UA negative for nitrite and leukocytes, positive for Hgb.  CT scan showed a 6 cm fibroid and a 2 cm left ovarian simple ovarian cyst.  No evidence of appendicitis or kidney stone on CT.  Patient's clinical presentations and laboratory/imaging studies are most concerned for leiomyoma. Advised patient to follow-up with OB/GYN for further  evaluation and management.  Return to the ER if new, worsening symptoms.. Toradol ordered. Reevaluation of the patient after these medications showed that the patient improved.   I have reviewed the patient home medicines and have made adjustments as needed.  Consultations obtained: I requested consultation with the attending Dr. Vanita Panda, and discussed lab and imaging findings as well as pertinent plan.  He agreed with the plan.  Disposition Continued outpatient therapy. Follow-up with OB-GYN recommended for reevaluation of symptoms. Treatment plan discussed with patient.  Pt acknowledged understanding was agreeable to the plan. Worrisome signs and symptoms were discussed with patient, and patient acknowledged understanding to return to the ED if they noticed these signs and symptoms. Patient was stable upon discharge.   This chart was dictated using voice recognition software.  Despite best efforts to proofread,  errors can occur which can change the documentation meaning.    {Document critical care time when appropriate:1} {Document review of labs and clinical decision tools ie heart score, Chads2Vasc2 etc:1}  {Document your independent review of radiology images, and any outside records:1} {Document your discussion with family members,  caretakers, and with consultants:1} {Document social determinants of health affecting pt's care:1} {Document your decision making why or why not admission, treatments were needed:1} Final Clinical Impression(s) / ED Diagnoses Final diagnoses:  Fibroid  Right lower quadrant abdominal pain    Rx / DC Orders ED Discharge Orders          Ordered    ketorolac (TORADOL) 10 MG tablet  Every 6 hours PRN        06/28/22 2050

## 2022-06-28 NOTE — ED Notes (Signed)
Pt ambulated to the bathroom w/ no issues.  Steady Gait.

## 2022-06-28 NOTE — ED Triage Notes (Signed)
C/o right side abd pain radiating to right groin and nausea x2 days. Hematuria noted this am Tylenol with relief.

## 2022-06-28 NOTE — Discharge Instructions (Addendum)
Please take tylenol/ibuprofen or Toradol for pain. I recommend close follow-up with PCP and OB-GYN for reevaluation.  Please do not hesitate to return to emergency department if worrisome signs symptoms we discussed become apparent.

## 2022-06-28 NOTE — ED Provider Triage Note (Signed)
Emergency Medicine Provider Triage Evaluation Note  Haley Steele , a 51 y.o. female  was evaluated in triage.  Pt complains of right lower quadrant abdominal pain for 2 days.  Patient states the pain radiates to her groin and her R leg.  The pain is sharp, intermittent, worse with movement.  She has tried Tylenol and ibuprofen with no relief.  Reports hematuria this morning.  Denies chest pain, shortness of breath, nausea, vomiting, fever, bowel changes, rash.  Review of Systems  Positive: As above Negative: As above  Physical Exam  BP 139/86 (BP Location: Left Arm)   Pulse (!) 112   Temp 98.1 F (36.7 C) (Oral)   Resp 16   SpO2 96%  Gen:   Awake, no distress   Resp:  Normal effort  MSK:   Moves extremities without difficulty  Other:  Tender to palpation to right lower quadrant  Medical Decision Making  Medically screening exam initiated at 2:47 PM.  Appropriate orders placed.  Haley Steele was informed that the remainder of the evaluation will be completed by another provider, this initial triage assessment does not replace that evaluation, and the importance of remaining in the ED until their evaluation is complete.     Rex Kras, PA 06/29/22 1022

## 2022-07-06 ENCOUNTER — Telehealth: Payer: Self-pay | Admitting: Pharmacist

## 2022-07-06 NOTE — Telephone Encounter (Signed)
Several unsuccessful outreach attempts made, left voicemails for callback. Attempted to schedule initial appointment with patient in response to a referral to pharmacy services to assist with medication access and optimizing control of diabetes.  Left another voicemail today, 07/06/22.  Await patient response to engage in services.   Larinda Buttery, PharmD Clinical Pharmacist [facility] [phone]

## 2022-07-08 ENCOUNTER — Ambulatory Visit (INDEPENDENT_AMBULATORY_CARE_PROVIDER_SITE_OTHER): Payer: BC Managed Care – PPO | Admitting: Pharmacist

## 2022-07-08 ENCOUNTER — Encounter: Payer: Self-pay | Admitting: Pharmacist

## 2022-07-08 DIAGNOSIS — E1165 Type 2 diabetes mellitus with hyperglycemia: Secondary | ICD-10-CM

## 2022-07-08 NOTE — Progress Notes (Signed)
07/08/2022 Name: Levetta Bognar MRN: [id number] DOB: 1971-02-26   Haley Steele is a 51 y.o. year old female who presented for a telephone visit.   They were referred to the pharmacist by their PCP for assistance in managing diabetes and medication access.    Summary, Plan, Follow Up:  - Recommend to continue current medications, pick up ozempic coupon at front desk to try to obtain ozempic with deductible buy-down voucher. Plan for patient to increase to ozempic 0.39m weekly on Dec 11, then will be eligible to increase to 193mweekly on Jan 1. After 1 month on 59m24meekly dose, can utilize RX for 2mg47mekly. - Follow Up Plan: phone call with pharmacist in ~1 month to assess medication access & ongoing dose titration    Subjective:  Care Team: Primary Care Provider: JessSamuel Bouche ; Next Scheduled Visit: 09/25/2022  Medication Access/Adherence  [id number] [place] [id number]: [phone] reports affordability concerns with their medications: Yes  - ozempic cost barrier due to high deductible health plan Patient reports access/transportation concerns to their pharmacy: No  Patient reports adherence concerns with their medications:  No    Of note, medication fill history appears outdated per fill estimates. Patient reports she had a large stock of medication prior to beginning this new job, as her benefits from her prior job covered diabetes medications at zero cost.   Diabetes:  Current medications: jardiance 25mg 59my, metformin XR 1000mg d44m, toujeo 12 units daily, ozempic 0.25 mg weekly (using sample), previously on 2mg pri17mto change in job & insurance  Current glucose readings: 110-125 fasting Using traditional meter; testing 1 times daily   Patient denies hypoglycemic s/sx including dizziness, shakiness, sweating.  Patient denies hyperglycemic symptoms including polyuria, polydipsia, polyphagia, nocturia, neuropathy, blurred vision.  Current meal patterns:  - Breakfast: to-go cereal, bagel - Lunch: fast food, chicken salad - Supper: light, sometimes don't eat, or fish in air fryer - Snacks: crackers - Drinks: gatorade, water, sodas (coke)  Current physical activity: none at present  Current medication access support: none at present   Health Maintenance  There are no preventive care reminders to display for this patient.   Objective: Lab Results  Component Value Date   HGBA1C 14.0 (A) 06/22/2022    Lab Results  Component Value Date   CREATININE 0.85 06/28/2022   BUN 14 06/28/2022   NA 134 (L) 06/28/2022   K 4.4 06/28/2022   CL 103 06/28/2022   CO2 15 (L) 06/28/2022    Lab Results  Component Value Date   CHOL 172 11/13/2021   HDL 46 (L) 11/13/2021   LDLCALC 107 (H) 11/13/2021   TRIG 101 11/13/2021   CHOLHDL 3.7 11/13/2021    Medications Reviewed Today     Reviewed by Jessup, Samuel Boucherse Practitioner) on 06/22/22 at 1556  Me20st Status: <None>   Medication Order Taking? Sig Documenting Provider Last Dose Status Informant  atorvastatin (LIPITOR) 40 MG tablet [id number] 1 TABLET BY MOUTH EVERY DAY Jessup, Samuel Boucheing Active   blood glucose meter kit and supplies KIT [id number] based on patient and insurance preference. Use up to four times daily as directed. Please include 100 lancets, 100 test strips, control solution - all to refill x99 as needed AlexandeEmeterio Reeveing Active   Continuous Blood Gluc Receiver (FREESTYLE LIBRE 14 DAY  READER) DEVI [id number] No Use as directed Emeterio Reeve, DO Taking Active   Continuous Blood Gluc Sensor (FREESTYLE LIBRE [place]) [place] [id number] No Use as directed Emeterio Reeve, DO Taking Active   empagliflozin (JARDIANCE) 25 MG TABS tablet [id number] No TAKE 1 TABLET (25 MG TOTAL) BY MOUTH DAILY.  Samuel Bouche, NP Taking Active   glucose blood (ACCU-CHEK AVIVA PLUS) test strip [id number] No USE AS DIRECTED FOR UP TO 4 TIMES DAILY. DX: E11.65 Samuel Bouche, NP Taking Active   insulin glargine, 2 Unit Dial, (TOUJEO MAX SOLOSTAR) 300 UNIT/ML Solostar Pen [id number] No Inject 10 Units into the skin daily. Titrate up by 2 units daily for a goal of fasting sugars of less than 130.  Maximum 50 units daily. Samuel Bouche, NP Taking Active   Insulin Pen Needle (PEN NEEDLES) 31G X 8 MM MISC [id number]  Inject into skin once daily. Samuel Bouche, NP  Active   lisinopril (ZESTRIL) 2.5 MG tablet [id number] No TAKE 1 TABLET BY MOUTH EVERY DAY Jessup, Joy, NP Taking Active   metFORMIN (GLUCOPHAGE-XR) 500 MG 24 hr tablet [id number] No TAKE 2 TABLETS (1,000 MG TOTAL) BY MOUTH DAILY. Silverio Decamp, MD Taking Active   norethindrone-ethinyl estradiol-FE (JUNEL FE 1/20) 1-20 MG-MCG tablet [id number] No Take 1 tablet by mouth daily. Samuel Bouche, NP Taking Active   Semaglutide, 2 MG/DOSE, 8 MG/3ML SOPN [id number] No Inject 2 mg as directed once a week. Samuel Bouche, NP Taking Active               Assessment/Plan:   Diabetes: - Currently uncontrolled - Reviewed long term cardiovascular and renal outcomes of uncontrolled blood sugar - Reviewed goal A1c, goal fasting, and goal 2 hour post prandial glucose - Reviewed dietary modifications including incorporate more fruit & vegetables, limit sugary drinks (gatorade, coke) in moderation - Recommend to continue current medications, pick up ozempic coupon at front desk to try to obtain ozempic with deductible buy-down voucher. Plan for patient to increase to ozempic 0.84m weekly on Dec 11, then will be eligible to increase to 190mweekly on Jan 1. After 1 month on 19m19meekly dose, can utilize RX for 2mg36mekly. - Recommend to check glucose daily   Follow Up Plan: phone call with pharmacist in ~1 month to assess medication access & ongoing dose titration  KeesLarinda ButteryarmD Clinical Pharmacist [facility]-[phone]

## 2022-07-13 NOTE — Progress Notes (Unsigned)
GYNECOLOGY OFFICE VISIT NOTE  History:   Haley Steele is a 51 y.o. G0P0000 here today for follow up from ED on 11/19. She went in for abdominal pain. Pain was acute in onset and lasted 2 days. She also had hematuria. She then got her period on 11/20 and it was sooner than expected. She has not had the pain since and has been doing well. Has had some constipation but otherwise no issues. She does not want a hysterectomy if she can avoid it.   She had a CT which showed her fibroids.  CT read of the fibroids:  Two pedunculated fibroids arise from the fundus measuring 9 and 4 cm Uterus measures 16 cm   I reviewed the CT myself and agree with their findings. Her HgB was 14.1. Her WBC 13.8 and baseline is usually 4-5. She had elevated neutrophils.   Contraception: Junel for cycle regulatrion Last Pap: 06/2021. Result was normal and HPV neg Last Mammogram: 02/2021.  Result was normal. [facility] [address] Last Colonoscopy: Cologard wnl 08/2021  She has some vulvar irritation but notes it is worse after having waxing. She has not yet tried anything.    Past Medical History:  Diagnosis Date   Diabetes mellitus without complication (HCC)    Fibroid    High cholesterol    Vaginal Pap smear, abnormal     History reviewed. No pertinent surgical history.  The following portions of the patient's history were reviewed and updated as appropriate: allergies, current medications, past family history, past medical history, past social history, past surgical history and problem list.   Health Maintenance:   Normal pap and negative HRHPV on 06/12/2021.   Diagnosis  Date Value Ref Range Status  06/12/2021   Final   - Negative for intraepithelial lesion or malignancy (NILM)   Normal mammogram on 02/09/2022.   Review of Systems:  Pertinent items noted in HPI and remainder of comprehensive ROS otherwise negative.  Physical Exam:  BP (!) 145/90   Pulse 99   Ht '5\' 4"'$  (1.626 m)    Wt 183 lb (83 kg)   LMP 06/29/2022   BMI 31.41 kg/m  CONSTITUTIONAL: Well-developed, well-nourished female in no acute distress.  HEENT:  Normocephalic, atraumatic. External right and left ear normal. No scleral icterus.  NECK: Normal range of motion, supple, no masses noted on observation SKIN: No rash noted. Not diaphoretic. No erythema. No pallor. MUSCULOSKELETAL: Normal range of motion. No edema noted. NEUROLOGIC: Alert and oriented to person, place, and time. Normal muscle tone coordination. No cranial nerve deficit noted. PSYCHIATRIC: Normal mood and affect. Normal behavior. Normal judgment and thought content.  PELVIC: Deferred  Labs and Imaging No results found for this or any previous visit (from the past 168 hour(s)). CT ABDOMEN PELVIS W CONTRAST  Result Date: 06/28/2022 CLINICAL DATA:  Right lower quadrant pain. EXAM: CT ABDOMEN AND PELVIS WITH CONTRAST TECHNIQUE: Multidetector CT imaging of the abdomen and pelvis was performed using the standard protocol following bolus administration of intravenous contrast. RADIATION DOSE REDUCTION: This exam was performed according to the departmental dose-optimization program which includes automated exposure control, adjustment of the mA and/or kV according to patient size and/or use of iterative reconstruction technique. CONTRAST:  132m OMNIPAQUE IOHEXOL 300 MG/ML  SOLN COMPARISON:  None Available. FINDINGS: Lower chest: No acute abnormality. Hepatobiliary: Hepatic steatosis. The liver, portal vein, and gallbladder is otherwise normal. Pancreas: Unremarkable. No pancreatic ductal dilatation or surrounding inflammatory changes. Spleen: Normal in size without  focal abnormality. Adrenals/Urinary Tract: Adrenal glands are unremarkable. Kidneys are normal, without renal calculi, focal lesion, or hydronephrosis. Bladder is unremarkable. Stomach/Bowel: The stomach and small bowel are normal. The appendix is normal. Vascular/Lymphatic: No significant  vascular findings are present. No enlarged abdominal or pelvic lymph nodes. Reproductive: The uterus is enlarged, extending into the abdomen above the level the umbilicus. The uterus contains multiple fibroids. The largest is located within the uterus itself measuring at least 16 cm in cranial caudal dimension. Two pedunculated fibroids arise from the fundus measuring 9 and 4 cm. There appear to be a since smaller fibroids within the uterus as well. The left fallopian tube and ovary are unremarkable. There is a 2 cm simple cyst in the left ovary. The right ovary is more prominent than the left and fluid in density, likely mild hydrosalpinx. No ovarian/adnexal mass. Other: No free air or free fluid. There is a fat containing umbilical hernia. Musculoskeletal: No acute or significant osseous findings. IMPRESSION: 1. The uterus is enlarged and extends into the abdomen above the level of the umbilicus. The uterus contains multiple fibroids, the largest of which measures at least 16 cm in cranial caudal dimension. 2. Probable mild right hydrosalpinx. 3. 2 cm left ovarian simple-appearing cyst. No follow-up imaging is recommended.Reference: JACR 2020 Feb;17(2):248-254 4. Hepatic steatosis. 5. No other abnormalities. Electronically Signed   By: Dorise Bullion III M.D.   On: 06/28/2022 17:16    Assessment and Plan:   1. Uterine leiomyoma, unspecified location Reviewed the findings on CT were consistent with my physical exam when she was not having any problems the last time. Reviewed I think the plan may remain the same - she does not need to have intervention for the fibroids unless growing/bothering her. Currently she is happy to monitor and knows they will shrink with menopause. We discussed Myfembree/Oriahnn and she does not wish to do these.  Discussed if she had a hysterectomy it would have to be open due to size of the fibroids. However, a very good alternative would be a [place] to help shrink them given how close  she is to menopause. She will keep this in mind if she starts to have issues.  Reviewed the only way I think the fibroids would cause this type of pain would be if she had degeneration of one or if a submucosal one was starting to come out, which is possible but not definitely the cause of her prior pain. Fortunately it has resolved so I would continue to monitor it.   Routine preventative health maintenance measures emphasized. Please refer to After Visit Summary for other counseling recommendations.   No follow-ups on file.  Radene Gunning, MD, [place] for [facility], [place]

## 2022-07-16 ENCOUNTER — Encounter: Payer: Self-pay | Admitting: Obstetrics and Gynecology

## 2022-07-16 ENCOUNTER — Ambulatory Visit (INDEPENDENT_AMBULATORY_CARE_PROVIDER_SITE_OTHER): Payer: BC Managed Care – PPO | Admitting: Obstetrics and Gynecology

## 2022-07-16 VITALS — BP 145/90 | HR 99 | Ht 64.0 in | Wt 183.0 lb

## 2022-07-16 DIAGNOSIS — D259 Leiomyoma of uterus, unspecified: Secondary | ICD-10-CM

## 2022-07-29 ENCOUNTER — Telehealth: Payer: Self-pay

## 2022-07-29 NOTE — Telephone Encounter (Signed)
Patient called needing a discount card for her test strips because they are trying to charge her $100.

## 2022-07-30 NOTE — Telephone Encounter (Signed)
Sent message through My Chart

## 2022-09-25 ENCOUNTER — Encounter: Payer: Self-pay | Admitting: Medical-Surgical

## 2022-09-25 ENCOUNTER — Ambulatory Visit (INDEPENDENT_AMBULATORY_CARE_PROVIDER_SITE_OTHER): Payer: BC Managed Care – PPO | Admitting: Medical-Surgical

## 2022-09-25 VITALS — BP 134/81 | HR 91 | Ht 60.0 in | Wt 185.1 lb

## 2022-09-25 DIAGNOSIS — E1159 Type 2 diabetes mellitus with other circulatory complications: Secondary | ICD-10-CM | POA: Diagnosis not present

## 2022-09-25 DIAGNOSIS — E1169 Type 2 diabetes mellitus with other specified complication: Secondary | ICD-10-CM | POA: Diagnosis not present

## 2022-09-25 DIAGNOSIS — E1165 Type 2 diabetes mellitus with hyperglycemia: Secondary | ICD-10-CM

## 2022-09-25 DIAGNOSIS — I152 Hypertension secondary to endocrine disorders: Secondary | ICD-10-CM

## 2022-09-25 DIAGNOSIS — E785 Hyperlipidemia, unspecified: Secondary | ICD-10-CM

## 2022-09-25 LAB — POCT GLYCOSYLATED HEMOGLOBIN (HGB A1C): HbA1c, POC (controlled diabetic range): 10.9 % — AB (ref 0.0–7.0)

## 2022-09-25 MED ORDER — SEMAGLUTIDE (2 MG/DOSE) 8 MG/3ML ~~LOC~~ SOPN: 2.0000 mg | PEN_INJECTOR | SUBCUTANEOUS | 1 refills | Status: DC

## 2022-09-25 MED ORDER — TOUJEO MAX SOLOSTAR 300 UNIT/ML ~~LOC~~ SOPN: 14.0000 [IU] | PEN_INJECTOR | Freq: Every day | SUBCUTANEOUS | 5 refills | Status: DC

## 2022-09-25 NOTE — Progress Notes (Signed)
Established Patient Office Visit  Subjective   Patient ID: Haley Steele, female   DOB: Aug 01, 1971 Age: 52 y.o. MRN: [id number]   Chief Complaint  Patient presents with   Follow-up    B/p a1c    HPI Pleasant 52 year old female presenting today for follow-up on:  Diabetes: Not checking her sugars regularly recently.  Notes that she has been out of Ozempic for about 3 weeks.  Previously taking 2 mg weekly, tolerating well without side effects.  Also taking Toujeo 12-14 units daily, tolerating well without side effects.  Taking metformin 1000 mg once daily as well as Jardiance 25 mg daily.  Has been trying to work on her diet to help reduce her A1c.  Last A1c checked 3 months ago was 14%.  Up-to-date on diabetic eye exam.  On statin and ACE per guidelines.  Hypertension: Not regularly checking blood pressure at home.  Taking lisinopril 2.5 mg daily, tolerating well without side effects.  Working to follow a low-sodium diet.  Activity as tolerated. Denies CP, SOB, palpitations, lower extremity edema, dizziness, headaches, or vision changes.    Objective:    Vitals:   09/25/22 1602 09/25/22 1632  BP: (!) 152/89 134/81  Pulse: 91   Height: 5' (1.524 m)   Weight: 185 lb 1.3 oz (84 kg)   SpO2: 98%   BMI (Calculated): 36.15     Physical Exam Vitals reviewed.  Constitutional:      General: She is not in acute distress.    Appearance: Normal appearance. She is not ill-appearing.  HENT:     Head: Normocephalic and atraumatic.  Cardiovascular:     Rate and Rhythm: Normal rate and regular rhythm.     Pulses: Normal pulses.     Heart sounds: Normal heart sounds.  Pulmonary:     Effort: Pulmonary effort is normal. No respiratory distress.     Breath sounds: Normal breath sounds. No wheezing, rhonchi or rales.  Skin:    General: Skin is warm and dry.  Neurological:     Mental Status: She is alert and oriented to person, place, and time.  Psychiatric:        Mood and  Affect: Mood normal.        Behavior: Behavior normal.        Thought Content: Thought content normal.        Judgment: Judgment normal.    Results for orders placed or performed in visit on 09/25/22 (from the past 24 hour(s))  POCT HgB A1C     Status: Abnormal   Collection Time: 09/25/22  4:09 PM  Result Value Ref Range   Hemoglobin A1C     HbA1c POC (<> result, manual entry)     HbA1c, POC (prediabetic range)     HbA1c, POC (controlled diabetic range) 10.9 (A) 0.0 - 7.0 %       The 10-year ASCVD risk score (Arnett DK, et al., 2019) is: 6.6%   Values used to calculate the score:     Age: 46 years     Sex: Female     Is Non-Hispanic African American: Yes     Diabetic: Yes     Tobacco smoker: No     Systolic Blood Pressure: Q000111Q mmHg     Is BP treated: No     HDL Cholesterol: 46 mg/dL     Total Cholesterol: 172 mg/dL   Assessment & Plan:   1. Type 2 diabetes mellitus with hyperglycemia, without long-term current use  of insulin (HCC) POCT hemoglobin A1c 10.9% today.  Great improvement however we do still have progress left make.  A1c was likely lower however she has been without Ozempic.  Restart Ozempic 2 mg weekly.  Continue Tresiba 12-14 units daily with titration for fasting sugars of 120 or less.  Strongly recommend checking fasting sugars daily.  Continue Jardiance and metformin as prescribed. - POCT HgB A1C  2. Hyperlipidemia associated with type 2 diabetes mellitus (HCC) Continue atorvastatin 40 mg daily.  3. Hypertension associated with diabetes ([place]) Blood pressure elevated on arrival however recheck looks okay at 134/81.  Continue lisinopril 2.5 mg daily.  Recommend monitoring blood pressure at home with a goal of 130/80 or less.  If consistently higher, we will need to make medication adjustments.  Low-sodium diet, regular intentional exercise, and weight loss to healthy weight recommended.  Return in about 3 months (around 12/24/2022) for DM/HTN/HLD follow  up.  ___________________________________________ Clearnce Sorrel, DNP, APRN, FNP-BC Primary Care and [place]

## 2022-09-29 ENCOUNTER — Encounter: Payer: Self-pay | Admitting: Medical-Surgical

## 2022-09-30 ENCOUNTER — Other Ambulatory Visit: Payer: BC Managed Care – PPO | Admitting: Pharmacist

## 2022-10-01 NOTE — Patient Instructions (Signed)
Cat,  I have arranged for the representative to drop off a voucher at Joy's office for you! They are going to drop it off on Friday 10/02/22.  We will be sure it has your name on it- please stop by either later Friday afternoon (just to be sure it is there by then) or Monday.  I'll touch base with you over the phone in 1 month to see how things are going, but send a mychart sooner if you run into issues.  Take care, Luana Shu, PharmD Clinical Pharmacist [facility] [phone]

## 2022-10-01 NOTE — Progress Notes (Signed)
10/01/2022 Name: Haley Steele MRN: [id number] DOB: 09/07/1970  Chief Complaint  Patient presents with   Medication Assistance    Haley Steele is a 52 y.o. year old female who presented for a telephone visit.   They were referred to the pharmacist by  clinic routed request  for assistance in managing diabetes and medication access.    Subjective:  Care Team: Primary Care Provider: Samuel Bouche, NP ; Next Scheduled Visit: 12/25/22   Medication Access/Adherence  [id number] [place] [place]: [phone]: [phone] [id number] [address] [address]: [phone]: [phone] Diabetes:  Current medications: jardiance 2512maily, toujeo 14 units daily, metformin XR 1000m34mily, ozempic 2mg 67mkly though had a lapse off ozempic due to cost  Current glucose readings: 100-110s, sometimes 130 in AM Using Libre 2 meter; testing in morning.  Patient denies hypoglycemic s/sx including dizziness, shakiness, sweating. Patient denies hyperglycemic symptoms including polyuria, polydipsia, polyphagia, nocturia, neuropathy, blurred vision.  Current medication access support: provided voucher for high-deductible buy-down in past which was successful, though patient is experiencing difficulty again at start of new year.   Objective:  Lab Results  Component Value Date   HGBA1C 10.9 (A) 09/25/2022    Lab Results  Component Value Date   CREATININE 0.85 06/28/2022   BUN 14 06/28/2022   NA 134 (L) 06/28/2022   K 4.4 06/28/2022   CL 103 06/28/2022   CO2 15 (L) 06/28/2022    Lab Results  Component Value Date   CHOL 172 11/13/2021   HDL 46 (L) 11/13/2021   LDLCALC 107 (H) 11/13/2021   TRIG 101 11/13/2021   CHOLHDL 3.7 11/13/2021    Medications Reviewed Today      Reviewed by KlineDarius Steele ([facility] Cross[facility]ist) on 10/01/22 at 0915 [place] Status: <None>   Medication Order Taking? Sig Documenting Provider Last Dose Status Informant  atorvastatin (LIPITOR) 40 MG tablet [id number] 1 TABLET BY MOUTH EVERY DAY JessuSamuel BoucheTaking Active   blood glucose meter kit and supplies KIT [id number] based on patient and insurance preference. Use up to four times daily as directed. Please include 100 lancets, 100 test strips, control solution - all to refill x99 as needed AlexaEmeterio ReeveTaking Active   Continuous Blood Gluc Receiver (FREESTYLE LIBRE 14 DAY READER) DEVI [id number] as directed AlexaEmeterio ReeveTaking Active   Continuous Blood Gluc Sensor (FREESTYLE LIBRE 14 Haley Steele [id number] as directed AlexaEmeterio ReeveTaking Active   empagliflozin (JARDIANCE) 25 MG TABS tablet [id number] 1 TABLET (25 MG TOTAL) BY MOUTH DAILY. JessuSamuel BoucheTaking Active   glucose blood (ACCU-CHEK AVIVA PLUS) test strip [id number] AS DIRECTED FOR UP TO 4 TIMES DAILY. DX: E11.65 JessuSamuel BoucheTaking Active   insulin glargine, 2 Unit Dial, (TOUJEO MAX SOLOSTAR) 300 UNIT/ML Solostar Pen [id number] 14 Units into the skin daily. Titrate up by 2 units daily for a goal of fasting sugars of less than 130.  Maximum 50 units daily. JessuSamuel Steele Active   Insulin Pen Needle (PEN NEEDLES) 31G X 8 MM MISC [id number] into skin once daily. JessuSamuel BoucheTaking Active   ketorolac (TORADOL) 10 MG tablet [id number] 1 tablet (10 mg  total) by mouth every 6 (six) hours as needed. Haley Kras, PA Taking Active   lisinopril (ZESTRIL) 2.5 MG tablet [id number] No TAKE 1 TABLET BY MOUTH EVERY DAY Haley Bouche, NP Taking Active   metFORMIN (GLUCOPHAGE-XR) 500 MG 24 hr tablet [id number] No TAKE 2 TABLETS (1,000 MG TOTAL) BY MOUTH DAILY. Haley Decamp, MD Taking Active            Med Note Haley Steele Jul 08, 2022  2:13 PM) Taking 1 tablet daily instead of 2 tablets due to GI effects. As of 07/08/22 review  norethindrone-ethinyl estradiol-FE (JUNEL FE 1/20) 1-20 MG-MCG tablet [id number] No Take 1 tablet by mouth daily. Haley Bouche, NP Taking Active   Semaglutide, 2 MG/DOSE, 8 MG/3ML SOPN [id number]  Inject 2 mg as directed once a week. Haley Bouche, NP  Active               Assessment/Plan:   Diabetes: - Currently uncontrolled, had a lapse in medication due to cost - Recommend to resume ozempic, arranged with manufacturer representative for providing voucher to assist with buy-down high deductible   Follow Up Plan: 1 month  Haley Steele, PharmD Clinical Pharmacist [facility] [phone]

## 2022-10-02 NOTE — Telephone Encounter (Signed)
Patient's Ozempic savings card had been dropped off at front desk, patient aware, will pick up by end of day or Monday, thanks.

## 2022-10-07 ENCOUNTER — Encounter: Payer: Self-pay | Admitting: Medical-Surgical

## 2022-10-07 ENCOUNTER — Telehealth: Payer: Self-pay | Admitting: Pharmacist

## 2022-10-07 ENCOUNTER — Other Ambulatory Visit: Payer: Self-pay | Admitting: Medical-Surgical

## 2022-10-07 DIAGNOSIS — E1169 Type 2 diabetes mellitus with other specified complication: Secondary | ICD-10-CM

## 2022-10-07 NOTE — Progress Notes (Signed)
Care Coordination Call  Called patient in response to ozempic voucher unable to process. Explained there has been a cyber attack that has rendered some of the outpatient pharmacies impaired with their claims systems. Patient mentioned [facility] is checking daily and keep patient updated if/when it will go through. Patient verbalized understanding and will reach out if further issues.  Larinda Buttery, PharmD Clinical Pharmacist [facility] [phone]

## 2022-10-09 ENCOUNTER — Ambulatory Visit (INDEPENDENT_AMBULATORY_CARE_PROVIDER_SITE_OTHER): Payer: BC Managed Care – PPO | Admitting: Medical-Surgical

## 2022-10-09 DIAGNOSIS — E1169 Type 2 diabetes mellitus with other specified complication: Secondary | ICD-10-CM | POA: Diagnosis not present

## 2022-10-09 DIAGNOSIS — E785 Hyperlipidemia, unspecified: Secondary | ICD-10-CM | POA: Diagnosis not present

## 2022-10-09 DIAGNOSIS — Z0289 Encounter for other administrative examinations: Secondary | ICD-10-CM

## 2022-10-09 NOTE — Progress Notes (Signed)
Agree with documentation as below.  ___________________________________________ Vishwa Dais L. Vaidehi Braddy, DNP, APRN, FNP-BC Primary Care and Sports Medicine [facility]  

## 2022-10-09 NOTE — Progress Notes (Signed)
Patient here to have waist measurement  done and fasting labs. Biometric form given back to carol.

## 2022-10-10 LAB — LIPID PANEL
Cholesterol: 190 mg/dL (ref <200)
HDL: 53 mg/dL (ref >=50)
LDL Cholesterol (Calc): 112 mg/dL (calc) — ABNORMAL HIGH
Non-HDL Cholesterol (Calc): 137 mg/dL (calc) — ABNORMAL HIGH (ref <130)
Total CHOL/HDL Ratio: 3.6 (calc) (ref <5.0)
Triglycerides: 132 mg/dL (ref <150)

## 2022-10-19 ENCOUNTER — Encounter: Payer: Self-pay | Admitting: Medical-Surgical

## 2022-10-20 NOTE — Telephone Encounter (Signed)
Ozempic sample pulled - marked with patient name and checked out in folder - left in refrigerator for patient pick up.

## 2022-10-30 ENCOUNTER — Telehealth: Payer: Self-pay | Admitting: Pharmacist

## 2022-10-30 ENCOUNTER — Other Ambulatory Visit: Payer: BC Managed Care – PPO | Admitting: Pharmacist

## 2022-10-30 NOTE — Progress Notes (Signed)
Attempted to contact patient x2 for scheduled appointment for medication management. Will send mychart and reschedule subsequent outreach.  Larinda Buttery, PharmD Clinical Pharmacist [facility] [phone]

## 2022-11-12 ENCOUNTER — Other Ambulatory Visit: Payer: BC Managed Care – PPO | Admitting: Pharmacist

## 2022-11-12 ENCOUNTER — Other Ambulatory Visit: Payer: Self-pay | Admitting: Medical-Surgical

## 2022-11-12 ENCOUNTER — Other Ambulatory Visit: Payer: Self-pay | Admitting: Pharmacist

## 2022-11-12 MED ORDER — ATORVASTATIN CALCIUM 40 MG PO TABS: 40.0000 mg | ORAL_TABLET | Freq: Every day | ORAL | 3 refills | Status: DC

## 2022-11-12 MED ORDER — TOUJEO MAX SOLOSTAR 300 UNIT/ML ~~LOC~~ SOPN: 14.0000 [IU] | PEN_INJECTOR | Freq: Every day | SUBCUTANEOUS | 5 refills | Status: DC

## 2022-11-12 MED ORDER — METFORMIN HCL ER 500 MG PO TB24: 1000.0000 mg | ORAL_TABLET | Freq: Every day | ORAL | 0 refills | Status: DC

## 2022-11-12 MED ORDER — SEMAGLUTIDE (2 MG/DOSE) 8 MG/3ML ~~LOC~~ SOPN: 2.0000 mg | PEN_INJECTOR | SUBCUTANEOUS | 1 refills | Status: DC

## 2022-11-12 MED ORDER — LISINOPRIL 2.5 MG PO TABS: 2.5000 mg | ORAL_TABLET | Freq: Every day | ORAL | 0 refills | Status: DC

## 2022-11-12 MED ORDER — NORETHIN ACE-ETH ESTRAD-FE 1-20 MG-MCG PO TABS: 1.0000 | ORAL_TABLET | Freq: Every day | ORAL | 11 refills | Status: DC

## 2022-11-12 MED ORDER — EMPAGLIFLOZIN 25 MG PO TABS: 25.0000 mg | ORAL_TABLET | Freq: Every day | ORAL | 1 refills | Status: DC

## 2022-11-12 MED ORDER — PEN NEEDLES 31G X 8 MM MISC: 99 refills | Status: DC

## 2022-11-12 MED ORDER — FREESTYLE LIBRE 3 SENSOR MISC: 3 refills | Status: DC

## 2022-11-12 MED ORDER — KETOROLAC TROMETHAMINE 10 MG PO TABS: 10.0000 mg | ORAL_TABLET | Freq: Four times a day (QID) | ORAL | 0 refills | Status: DC | PRN

## 2022-11-12 NOTE — Progress Notes (Signed)
Outreached patient to discuss glucose control and medication management. Left voicemail for patient to return my call at their convenience.   Larinda Buttery, PharmD, BCPS Clinical Pharmacist [facility]

## 2022-11-12 NOTE — Addendum Note (Signed)
Addended bySamuel Bouche on: 11/12/2022 12:58 PM   Modules accepted: Orders

## 2022-11-12 NOTE — Patient Instructions (Signed)
Cat,  As discussed, we will send all your medications to [facility] on [address]. After you bring them your insurance card as well as the voucher info for ozempic, let us know if you run into any issues. We are here to support you getting access to your medications!  Take care, Luana Shu, PharmD, [facility]

## 2022-11-12 NOTE — Progress Notes (Signed)
11/12/2022 Name: Haley Steele MRN: [id number] DOB: 03-28-1971  Chief Complaint  Patient presents with   Diabetes   Medication Assistance    Haley Steele is a 52 y.o. year old female who presented for a telephone visit.   They were referred to the pharmacist by  clinic routed request  for assistance in managing diabetes and medication access.    Subjective:  Care Team: Primary Care Provider: Samuel Bouche, NP ; Next Scheduled Visit: 12/25/22   Medication Access/Adherence  Current Pharmacy:  [facility] ([place]), [place] [id number] [place] [address] Phone: [phone] Fax: [phone]  [id number] [place], [address] [place] [address] Phone: [phone] Fax: [phone]  [id number] [place], [address] [facility] [address] [place] [address] Phone: [phone] [phone]   Diabetes:  Current medications: jardiance 25mg  daily, toujeo 14 units daily, metformin XR 1000mg  daily, ozempic 2mg  weekly though had a lapse off ozempic due to cost. She is currently having major issues sourcing ozempic due to copay card/deductible voucher unable to process due to cyber security attack. Also having issues with her current pharmacy sourcing stock of other routine medications.  Current glucose readings: not currently checking, her current pharmacy does not have her [employer] available.  Patient denies hypoglycemic s/sx including dizziness, shakiness, sweating. Patient denies hyperglycemic symptoms including polyuria, polydipsia, polyphagia, nocturia, neuropathy, blurred vision.  Current medication access support: provided voucher for high-deductible buy-down in past which was successful in past, but currently an issue with cybersecurity attack.  Objective:  Lab Results  Component Value Date   HGBA1C 10.9 (A) 09/25/2022     Lab Results  Component Value Date   CREATININE 0.85 06/28/2022   BUN 14 06/28/2022   NA 134 (L) 06/28/2022   K 4.4 06/28/2022   CL 103 06/28/2022   CO2 15 (L) 06/28/2022    Lab Results  Component Value Date   CHOL 190 10/09/2022   HDL 53 10/09/2022   LDLCALC 112 (H) 10/09/2022   TRIG 132 10/09/2022   CHOLHDL 3.6 10/09/2022    Medications Reviewed Today     Reviewed by Darius Bump, [place] (Pharmacist) on 11/12/22 at 1142  Med List Status: <None>   Medication Order Taking? Sig Documenting Provider Last Dose Status Informant  atorvastatin (LIPITOR) 40 MG tablet [id number] No TAKE 1 TABLET BY MOUTH EVERY DAY Samuel Bouche, NP Taking Active   blood glucose meter kit and supplies KIT [id number] No Dispense based on patient and insurance preference. Use up to four times daily as directed. Please include 100 lancets, 100 test strips, control solution - all to refill x99 as needed Emeterio Reeve, DO Taking Active   Continuous Blood Gluc Receiver (FREESTYLE LIBRE 14 DAY READER) [place] [id number] No Use as directed Emeterio Reeve, DO Taking Active   Continuous Blood Gluc Sensor (FREESTYLE LIBRE [place]) [place] [id number] No Use as directed Emeterio Reeve, DO Taking Active   empagliflozin (JARDIANCE) 25 MG TABS tablet [id number] No TAKE 1 TABLET (25 MG TOTAL) BY MOUTH DAILY. Samuel Bouche, NP Taking Active   glucose blood (ACCU-CHEK AVIVA PLUS) test strip [id number] No USE AS DIRECTED FOR UP TO 4 TIMES DAILY. DX: E11.65 Samuel Bouche, NP Taking Active   insulin glargine, 2 Unit Dial, (TOUJEO MAX SOLOSTAR) 300 UNIT/ML Solostar Pen [id number] No Inject 14 Units into the skin daily. Titrate up by 2 units  daily for a goal of fasting sugars of less than 130.  Maximum 50 units daily. Samuel Bouche, NP Taking Active   Insulin Pen Needle (PEN NEEDLES) 31G X 8 MM MISC [id number] No Inject into skin once daily. Samuel Bouche, NP Taking Active   ketorolac (TORADOL) 10 MG tablet [id number] No Take 1 tablet  (10 mg total) by mouth every 6 (six) hours as needed. Rex Kras, PA Taking Active   lisinopril (ZESTRIL) 2.5 MG tablet [id number] No TAKE 1 TABLET BY MOUTH EVERY DAY Samuel Bouche, NP Taking Active   metFORMIN (GLUCOPHAGE-XR) 500 MG 24 hr tablet [id number] No TAKE 2 TABLETS (1,000 MG TOTAL) BY MOUTH DAILY. Silverio Decamp, MD Taking Active            Med Note Dorene Ar Jul 08, 2022  2:13 PM) Taking 1 tablet daily instead of 2 tablets due to GI effects. As of 07/08/22 review  norethindrone-ethinyl estradiol-FE (JUNEL FE 1/20) 1-20 MG-MCG tablet [id number] No Take 1 tablet by mouth daily. Samuel Bouche, NP Taking Active   Semaglutide, 2 MG/DOSE, 8 MG/3ML SOPN [id number] No Inject 2 mg as directed once a week. Samuel Bouche, NP Taking Active               Assessment/Plan:   Diabetes: - Currently uncontrolled, had a lapse in medication due to cost and barriers with supply at the pharmacy - Patient requesting ALL prescriptions sent to [facility], new pharmacy, as she wishes to move away from her current pharmacy. Updated pharmacy preferences in patient profile and will collaborate with PCP.  - Also recommend DC libre 2 RX's, send for [place] sensors to new pharmacy. Cost should not be impacted.   Follow Up Plan: 1 month  Larinda Buttery, PharmD Clinical Pharmacist [facility] [phone]

## 2022-11-16 ENCOUNTER — Other Ambulatory Visit: Payer: BC Managed Care – PPO | Admitting: Pharmacist

## 2022-11-16 NOTE — Progress Notes (Signed)
11/16/2022 Name: Haley Steele MRN: [id number] DOB: 04-07-1971  No chief complaint on file.   Klani Steele is a 52 y.o. year old female who presented for a telephone visit. Outreached patient in response to [employer].   They were referred to the pharmacist by their PCP for assistance in managing diabetes and medication access.    Subjective:  Care Team: Primary Care Provider: Christen Butter, NP ; Next Scheduled Visit: 12/25/22   Medication Access/Adherence  Current Pharmacy:  [facility] [address] ([place]), [place] - [address] [place] ([place]) [place] [address] Phone: [phone] Fax: [phone]  [phone] [place], [place] - [address] [place] [address] Phone: [phone] Fax: [phone]  [id number] [place], [place] - [address] [place] [address] Phone: [phone] Fax: [phone]   Diabetes:  Current medications: jardiance 25mg  daily, toujeo 14 units daily, metformin XR 1000mg  daily, (ozempic 2mg  weekly though unable to obtain due to cost - voucher to buy-down high insurance deductible but this goes from $2500 to $1400 which is still too high to manage).   We recently switched pharmacies at patient request, and she is obtaining her medications now from [place].  Current glucose readings: not currently checking, still in process to obtain libre affordably  Patient denies hypoglycemic s/sx including dizziness, shakiness, sweating. Patient denies hyperglycemic symptoms including polyuria, polydipsia, polyphagia, nocturia, neuropathy, blurred vision.  Current medication access support: provided voucher for high-deductible buy-down in past which was successful in past, but still remains too high, not enough other deductible spending for the 2024 year.  Objective:  Lab Results  Component Value Date   HGBA1C 10.9 (A) 09/25/2022     Lab Results  Component Value Date   CREATININE 0.85 06/28/2022   BUN 14 06/28/2022   NA 134 (L) 06/28/2022   K 4.4 06/28/2022   CL 103 06/28/2022   CO2 15 (L) 06/28/2022    Lab Results  Component Value Date   CHOL 190 10/09/2022   HDL 53 10/09/2022   LDLCALC 112 (H) 10/09/2022   TRIG 132 10/09/2022   CHOLHDL 3.6 10/09/2022    Medications Reviewed Today     Reviewed by Gabriel Carina, RPH (Pharmacist) on 11/12/22 at 1142  Med List Status: <None>   Medication Order Taking? Sig Documenting Provider Last Dose Status Informant  atorvastatin (LIPITOR) 40 MG tablet [id number] No TAKE 1 TABLET BY MOUTH EVERY DAY Christen Butter, NP Taking Active   blood glucose meter kit and supplies KIT [id number] No Dispense based on patient and insurance preference. Use up to four times daily as directed. Please include 100 lancets, 100 test strips, control solution - all to refill x99 as needed Sunnie Nielsen, DO Taking Active   Continuous Blood Gluc Receiver (FREESTYLE LIBRE 14 DAY READER) DEVI [id number] No Use as directed Sunnie Nielsen, DO Taking Active   Continuous Blood Gluc Sensor (FREESTYLE LIBRE 14 DAY SENSOR) [place] [id number] No Use as directed Sunnie Nielsen, DO Taking Active   empagliflozin (JARDIANCE) 25 MG TABS tablet [id number] No TAKE 1 TABLET (25 MG TOTAL) BY MOUTH DAILY. Christen Butter, NP Taking Active   glucose blood (ACCU-CHEK AVIVA PLUS) test strip [id number] No USE AS DIRECTED FOR UP TO 4 TIMES DAILY. DX: E11.65 Christen Butter, NP Taking Active   insulin glargine, 2 Unit Dial, (TOUJEO MAX SOLOSTAR) 300 UNIT/ML Solostar Pen [id number] No Inject 14 Units into the skin daily. Titrate up by  2 units daily for a goal of fasting sugars of less than 130.  Maximum 50 units daily. Christen Butter, NP Taking Active   Insulin Pen Needle (PEN NEEDLES) 31G X 8 MM MISC [id number] No Inject into skin once daily. Christen Butter, NP Taking Active   ketorolac (TORADOL) 10 MG tablet [id number] No Take 1 tablet  (10 mg total) by mouth every 6 (six) hours as needed. Jeanelle Malling, PA Taking Active   lisinopril (ZESTRIL) 2.5 MG tablet [id number] No TAKE 1 TABLET BY MOUTH EVERY DAY Christen Butter, NP Taking Active   metFORMIN (GLUCOPHAGE-XR) 500 MG 24 hr tablet [id number] No TAKE 2 TABLETS (1,000 MG TOTAL) BY MOUTH DAILY. Monica Becton, MD Taking Active            Med Note Enid Derry Jul 08, 2022  2:13 PM) Taking 1 tablet daily instead of 2 tablets due to GI effects. As of 07/08/22 review  norethindrone-ethinyl estradiol-FE (JUNEL FE 1/20) 1-20 MG-MCG tablet [id number] No Take 1 tablet by mouth daily. Christen Butter, NP Taking Active   Semaglutide, 2 MG/DOSE, 8 MG/3ML SOPN [id number] No Inject 2 mg as directed once a week. Christen Butter, NP Taking Active               Assessment/Plan:   Diabetes: - Currently uncontrolled, had a lapse in medication due to cost and barriers with supply at the pharmacy, working on resolutions. Patient did obtain toujeo recently with success. -  Recommend patient contact [phone] for voucher to cap the cost of Libre 3 sensors to $75. Will share with patient.    Follow Up Plan:2-4 weeks to discuss CGM and BG readings  Lynnda Shields, PharmD Clinical Pharmacist [facility] [phone]

## 2022-11-16 NOTE — Patient Instructions (Signed)
Cat,  For capping the cost of Libre sensors to $75, contact this number: [phone]  They will help with a voucher. Send me a mychart if you have any issues - and I will plan to follow up with you in 2-4 weeks over the phone for [place] to review your blood sugars. Contact me sooner if you would like help/guidance with your libre sensors!  Take care, Elmarie Shiley, PharmD, BCPS Clinical Pharmacist [facility]

## 2022-12-03 ENCOUNTER — Ambulatory Visit: Payer: BC Managed Care – PPO | Admitting: Obstetrics and Gynecology

## 2022-12-03 ENCOUNTER — Encounter: Payer: Self-pay | Admitting: Medical-Surgical

## 2022-12-07 ENCOUNTER — Ambulatory Visit (INDEPENDENT_AMBULATORY_CARE_PROVIDER_SITE_OTHER): Payer: BC Managed Care – PPO | Admitting: Obstetrics and Gynecology

## 2022-12-07 ENCOUNTER — Other Ambulatory Visit (HOSPITAL_COMMUNITY)
Admission: RE | Admit: 2022-12-07 | Discharge: 2022-12-07 | Disposition: A | Discharge status: Home / Self Care | Payer: BC Managed Care – PPO | Source: Ambulatory Visit | Attending: Obstetrics and Gynecology | Admitting: Obstetrics and Gynecology

## 2022-12-07 ENCOUNTER — Encounter: Payer: Self-pay | Admitting: Obstetrics and Gynecology

## 2022-12-07 VITALS — BP 139/85 | HR 88 | Ht 60.0 in | Wt 174.0 lb

## 2022-12-07 DIAGNOSIS — Z113 Encounter for screening for infections with a predominantly sexual mode of transmission: Secondary | ICD-10-CM

## 2022-12-07 DIAGNOSIS — N898 Other specified noninflammatory disorders of vagina: Secondary | ICD-10-CM | POA: Diagnosis not present

## 2022-12-07 DIAGNOSIS — L259 Unspecified contact dermatitis, unspecified cause: Secondary | ICD-10-CM | POA: Diagnosis not present

## 2022-12-07 NOTE — Patient Instructions (Addendum)
We are going to rule out a yeast infection or contact dermatitis (meaning irritation from things in the environment).  - Switch all your soaps and detergents to dye & perfume free. This is usually the sensitive skin formulas.  - Skip dryer sheets for your pants & underwear - Try to wear breathable loose fitting bottoms as much as possible. Avoid sleeping in underwear - Avoid any harsh scrubbing of the area  Apply a thin layer of vaseline over the affected areas 1-2 times a day to provide protection for the sensitive skin

## 2022-12-07 NOTE — Progress Notes (Signed)
RETURN GYNECOLOGY VISIT  Subjective:  Haley Steele is a 52 y.o. G0P0000 presenting for vulvar irritation x 1 year.   Present at her last appt with Dr. Para March, initially thought it could be medication side effect. Reports vulvar erythema & itching that have been getting worse for the past year. Tried neosporin, gold bond, and OTC hydrocortisone without relief. It is most itchy when she is in the shower and scrubs the area. She stopped all of her medications besides birth control for 3 months without any changes.   She uses scented Dove soap and Gain detergent with dryer sheets. She wears cotton and lace underwear. Has some UUI but is staying dry and doesn't need to wear daily pads/liners.    I personally reviewed the following: - Last pap NILM/HPV neg 06/12/2021 - A1c 10.9 09/25/22  Past Medical History:  Diagnosis Date   Diabetes mellitus without complication (HCC)    Fibroid    High cholesterol    Vaginal Pap smear, abnormal    History reviewed. No pertinent surgical history. Current Outpatient Medications on File Prior to Visit  Medication Sig Dispense Refill   atorvastatin (LIPITOR) 40 MG tablet Take 1 tablet (40 mg total) by mouth daily. 90 tablet 3   blood glucose meter kit and supplies KIT Dispense based on patient and insurance preference. Use up to four times daily as directed. Please include 100 lancets, 100 test strips, control solution - all to refill x99 as needed 1 each 1   Continuous Blood Gluc Sensor (FREESTYLE LIBRE 3 SENSOR) MISC Place 1 sensor on the skin every 14 days. Use to check glucose continuously 6 each 3   empagliflozin (JARDIANCE) 25 MG TABS tablet Take 1 tablet (25 mg total) by mouth daily. 90 tablet 1   glucose blood (ACCU-CHEK AVIVA PLUS) test strip USE AS DIRECTED FOR UP TO 4 TIMES DAILY. DX: E11.65 400 strip 5   insulin glargine, 2 Unit Dial, (TOUJEO MAX SOLOSTAR) 300 UNIT/ML Solostar Pen Inject 14 Units into the skin daily. Titrate up by 2  units daily for a goal of fasting sugars of less than 130.  Maximum 50 units daily. 6 mL 5   Insulin Pen Needle (PEN NEEDLES) 31G X 8 MM MISC Inject into skin once daily. 30 each PRN   ketorolac (TORADOL) 10 MG tablet Take 1 tablet (10 mg total) by mouth every 6 (six) hours as needed. 20 tablet 0   lisinopril (ZESTRIL) 2.5 MG tablet Take 1 tablet (2.5 mg total) by mouth daily. 90 tablet 0   metFORMIN (GLUCOPHAGE-XR) 500 MG 24 hr tablet Take 2 tablets (1,000 mg total) by mouth daily. 180 tablet 0   norethindrone-ethinyl estradiol-FE (JUNEL FE 1/20) 1-20 MG-MCG tablet Take 1 tablet by mouth daily. 28 tablet 11   Semaglutide, 2 MG/DOSE, 8 MG/3ML SOPN Inject 2 mg as directed once a week. 9 mL 1   No current facility-administered medications on file prior to visit.   No Known Allergies OB History     Gravida  0   Para  0   Term  0   Preterm  0   AB  0   Living  0      SAB  0   IAB  0   Ectopic  0   Multiple  0   Live Births  0          Social History   Socioeconomic History   Marital status: Divorced    Spouse name: Not  on file   Number of children: 0   Years of education: Not on file   Highest education level: Not on file  Occupational History   Occupation: Presenter, broadcasting    Employer: [employer]  Tobacco Use   Smoking status: Never   Smokeless tobacco: Never  Vaping Use   Vaping Use: Never used  Substance and Sexual Activity   Alcohol use: Yes    Alcohol/week: 0.0 standard drinks of alcohol   Drug use: No   Sexual activity: Not Currently    Partners: Male    Birth control/protection: Condom  Other Topics Concern   Not on file  Social History Narrative   Not on file   Social Determinants of Health   Financial Resource Strain: Not on file  Food Insecurity: Not on file  Transportation Needs: Not on file  Physical Activity: Not on file  Stress: Not on file  Social Connections: Not on file  Intimate Partner Violence: Not on file     Objective:   Vitals:   12/07/22 1426  BP: 139/85  Pulse: 88  Weight: 174 lb (78.9 kg)  Height: 5' (1.524 m)    General:  Alert, oriented and cooperative. Patient is in no acute distress.  Skin: Skin is warm and dry. No rash noted.   Cardiovascular: Normal heart rate noted  Respiratory: Normal respiratory effort, no problems with respiration noted  Abdomen: Soft, non-tender, non-distended   Pelvic: Diffuse erythema over vulva, vagina, and perianal area w/ keyhole appearance. Nontender no warmth or purulence. Thick white discharge  Exam performed in the presence of a chaperone  Assessment and Plan:  Haley Steele is a 52 y.o. with vulvar irritation, suspect contact dermatitis.   Contact dermatitis, unspecified contact dermatitis type, unspecified trigger Vaginal irritation Exam today most consistent with contact dermatitis, but ddx also includes vulvovaginal candidiasis or lichen sclerosus.  Will rule out VVC and trial conservative measures including emollients & avoidance of harsh detergents/perfumes/dyes x 6 weeks. Follow up in 6 weeks to reassess  -     Cervicovaginal ancillary only( [place])  Screening examination for STI -     Cervicovaginal ancillary only( [place])  Return in about 6 weeks (around 01/18/2023) for return GYN to follow up vulvar skin irritation.  Future Appointments  Date Time Provider Department Center  12/25/2022  3:00 PM Christen Butter, NP [facility] None  02/01/2023  1:10 PM Lennart Pall, MD [facility]   Lennart Pall, MD

## 2022-12-08 LAB — CERVICOVAGINAL ANCILLARY ONLY
Bacterial Vaginitis (gardnerella): NEGATIVE
Candida Glabrata: POSITIVE — AB
Candida Vaginitis: POSITIVE — AB
Chlamydia: NEGATIVE
Comment: NEGATIVE
Comment: NEGATIVE
Comment: NEGATIVE
Comment: NEGATIVE
Comment: NEGATIVE
Comment: NORMAL
Neisseria Gonorrhea: NEGATIVE
Trichomonas: NEGATIVE

## 2022-12-08 MED ORDER — BORIC ACID VAGINAL 600 MG VA SUPP: 600.0000 mg | Freq: Every evening | VAGINAL | 0 refills | Status: AC

## 2022-12-08 NOTE — Addendum Note (Signed)
Addended by: Harvie Bridge on: 12/08/2022 10:05 PM   Modules accepted: Orders

## 2022-12-09 ENCOUNTER — Encounter: Payer: Self-pay | Admitting: Obstetrics and Gynecology

## 2022-12-14 ENCOUNTER — Other Ambulatory Visit: Payer: Self-pay | Admitting: Medical-Surgical

## 2022-12-21 ENCOUNTER — Encounter: Payer: Self-pay | Admitting: Medical-Surgical

## 2022-12-21 DIAGNOSIS — E1165 Type 2 diabetes mellitus with hyperglycemia: Secondary | ICD-10-CM

## 2022-12-22 MED ORDER — SITAGLIPTIN PHOSPHATE 100 MG PO TABS: 100.0000 mg | ORAL_TABLET | Freq: Every day | ORAL | 3 refills | Status: DC

## 2022-12-22 MED ORDER — ACCU-CHEK AVIVA PLUS VI STRP: ORAL_STRIP | 99 refills | Status: DC

## 2022-12-25 ENCOUNTER — Ambulatory Visit (INDEPENDENT_AMBULATORY_CARE_PROVIDER_SITE_OTHER): Payer: BC Managed Care – PPO | Admitting: Medical-Surgical

## 2022-12-25 ENCOUNTER — Encounter: Payer: Self-pay | Admitting: Medical-Surgical

## 2022-12-25 VITALS — BP 116/75 | HR 96 | Resp 20 | Ht 60.0 in | Wt 177.6 lb

## 2022-12-25 DIAGNOSIS — E1159 Type 2 diabetes mellitus with other circulatory complications: Secondary | ICD-10-CM | POA: Diagnosis not present

## 2022-12-25 DIAGNOSIS — E1169 Type 2 diabetes mellitus with other specified complication: Secondary | ICD-10-CM

## 2022-12-25 DIAGNOSIS — Z7984 Long term (current) use of oral hypoglycemic drugs: Secondary | ICD-10-CM

## 2022-12-25 DIAGNOSIS — I152 Hypertension secondary to endocrine disorders: Secondary | ICD-10-CM

## 2022-12-25 DIAGNOSIS — E1165 Type 2 diabetes mellitus with hyperglycemia: Secondary | ICD-10-CM | POA: Diagnosis not present

## 2022-12-25 DIAGNOSIS — E785 Hyperlipidemia, unspecified: Secondary | ICD-10-CM

## 2022-12-25 LAB — POCT GLYCOSYLATED HEMOGLOBIN (HGB A1C): HbA1c POC (<> result, manual entry): >14 % (ref 4.0–5.6)

## 2022-12-25 MED ORDER — GLIPIZIDE 5 MG PO TABS: 5.0000 mg | ORAL_TABLET | Freq: Two times a day (BID) | ORAL | 3 refills | Status: DC

## 2022-12-25 NOTE — Progress Notes (Signed)
        Established patient visit  History, exam, impression, and plan:  1. Type 2 diabetes mellitus with hyperglycemia, without long-term current use of insulin [facility]) Pleasant 52 year old female presenting for follow-up on diabetes.  She has been working with our clinical pharmacist regarding diabetes management.  Unfortunately, her insurance has a very high deductible plan and she has not been able to afford Ozempic.  Recently sent a message to have Jardiance and Januvia both refilled.  These were sent to the pharmacy however the cost is exorbitant and she is unable to afford this at this time.  Has been using Toujeo 14 units daily and metformin 1000 mg daily.  Not regularly checking sugars.  POCT hemoglobin A1c last checked in February at 10.9%.  Recheck today at greater than 14%.  Recommend checking fasting sugars daily.  Start titration of Toujeo up by 2 units for fasting sugars greater than 130.  Will need to plan to titrate up as needed about every 3 days.  Maximum Toujeo daily 50 units for now.  Continue metformin 1000 mg daily as higher doses were not tolerated.  Adding glipizide 5 mg twice daily with meals.  Forwarding this note to our clinical pharmacist for her review and any recommendations that she may have. - POCT HgB A1C  2. Hyperlipidemia associated with type 2 diabetes mellitus (HCC) Currently taking atorvastatin 40 mg daily, tolerating well without side effects.  Following a low-fat heart healthy diet.  Recent labs stable.  3. Hypertension associated with diabetes (HCC) Blood pressure at goal today.  Currently taking lisinopril 2.5 mg daily, tolerating well without side effects.  Not regularly checking blood pressures at home.  Denies CP, SOB, palpitations, LE edema, HA, and dizziness.  On exam, HRR, S1/S2 normal.  Lungs CTA, respirations even and unlabored.  No peripheral edema.  Continue lisinopril 2.5 mg daily as prescribed.  Procedures performed this visit: None.  Return in  about 3 months (around 03/27/2023) for DM follow up.  __________________________________ Thayer Ohm, DNP, APRN, FNP-BC Primary Care and Sports Medicine [facility]

## 2023-01-13 DIAGNOSIS — L723 Sebaceous cyst: Secondary | ICD-10-CM | POA: Diagnosis not present

## 2023-01-13 DIAGNOSIS — L720 Epidermal cyst: Secondary | ICD-10-CM | POA: Diagnosis not present

## 2023-02-01 ENCOUNTER — Encounter: Payer: Self-pay | Admitting: Obstetrics and Gynecology

## 2023-02-01 ENCOUNTER — Ambulatory Visit (INDEPENDENT_AMBULATORY_CARE_PROVIDER_SITE_OTHER): Payer: BC Managed Care – PPO | Admitting: Obstetrics and Gynecology

## 2023-02-01 ENCOUNTER — Other Ambulatory Visit (HOSPITAL_COMMUNITY)
Admission: RE | Admit: 2023-02-01 | Discharge: 2023-02-01 | Disposition: A | Discharge status: Home / Self Care | Payer: BC Managed Care – PPO | Source: Ambulatory Visit | Attending: Obstetrics and Gynecology | Admitting: Obstetrics and Gynecology

## 2023-02-01 VITALS — BP 142/83 | HR 98 | Resp 16 | Ht 60.0 in | Wt 178.0 lb

## 2023-02-01 DIAGNOSIS — R030 Elevated blood-pressure reading, without diagnosis of hypertension: Secondary | ICD-10-CM | POA: Diagnosis not present

## 2023-02-01 DIAGNOSIS — B379 Candidiasis, unspecified: Secondary | ICD-10-CM | POA: Diagnosis not present

## 2023-02-01 DIAGNOSIS — N898 Other specified noninflammatory disorders of vagina: Secondary | ICD-10-CM | POA: Diagnosis not present

## 2023-02-01 NOTE — Progress Notes (Signed)
   RETURN GYNECOLOGY VISIT  Subjective:  Haley Steele is a 52 y.o. G0 presenting for follow up of vulvar itching.   Seen by myself on 4/29. Exam concerning for contact dermatitis. Culture also c/w candida glabrata. Now s/p boric acid nightly x 3 weeks. She has also changed her detergents & soaps to minimize vulvar irritation.   Today, reports her itching has resolved. Still has some vulvar redness but it is much better from her previous appointment.   Objective:   Vitals:   02/01/23 1300 02/01/23 1314  BP: (!) 146/90 (!) 142/83  Pulse: 96 98  Resp: 16   Weight: 178 lb (80.7 kg)   Height: 5' (1.524 m)    General:  Alert, oriented and cooperative. Patient is in no acute distress.  Skin: Skin is warm and dry. No rash noted.   Cardiovascular: Normal heart rate noted  Respiratory: Normal respiratory effort, no problems with respiration noted  Abdomen: Soft, non-tender, non-distended   Pelvic: Mild erythema around the labia minora & inner portion of labia majora but remainder of vulvar/perineal erythema resolved. No warmth or tenderness. Thin white physiologic discharge.   Exam performed in the presence of a chaperone  Assessment and Plan:  Haley Steele is a 52 y.o. with improved vulvar itching in s/o likely contact/irritant dermatitis & candida glabrata infection  Candida glabrata infection Encouraged continued adherence to lifestyle changes that have helped her itching resolve. Discussed using Gain for her shirts but continuing the unscented laundry detergent for underwear & bottoms.  Will retest for glabrata today -     Cervicovaginal ancillary only( [place])  Elevated blood pressure reading Recommended PCP follow up  Follow up if symptoms return. UTD on health maintenance, has MMG scheduled for next weekend.   Future Appointments  Date Time Provider Department Center  04/02/2023  3:20 PM Christen Butter, NP [facility] None    Lennart Pall, MD

## 2023-02-02 LAB — CERVICOVAGINAL ANCILLARY ONLY
Candida Glabrata: POSITIVE — AB
Candida Vaginitis: POSITIVE — AB
Comment: NEGATIVE
Comment: NEGATIVE

## 2023-02-02 MED ORDER — FLUCONAZOLE 150 MG PO TABS: 150.0000 mg | ORAL_TABLET | Freq: Once | ORAL | 0 refills | Status: AC

## 2023-02-02 NOTE — Addendum Note (Signed)
Addended by: Harvie Bridge on: 02/02/2023 08:30 PM   Modules accepted: Orders

## 2023-02-19 DIAGNOSIS — Z1231 Encounter for screening mammogram for malignant neoplasm of breast: Secondary | ICD-10-CM | POA: Diagnosis not present

## 2023-02-19 LAB — HM MAMMOGRAPHY

## 2023-03-08 ENCOUNTER — Telehealth: Payer: Self-pay

## 2023-03-08 NOTE — Progress Notes (Signed)
   03/08/2023  Patient ID: Haley Steele, female   DOB: 08-May-1971, 52 y.o.   MRN: [id number]  Outreach attempt to schedule telephone visit to check on medication management of diabetes.  I was not able to reach the patient but did leave HIPAA compliant voicemail with my direct phone number.  I will also send a MyChart message.  Lenna Gilford, PharmD, DPLA

## 2023-03-11 ENCOUNTER — Encounter: Payer: Self-pay | Admitting: Medical-Surgical

## 2023-03-19 ENCOUNTER — Other Ambulatory Visit: Payer: BC Managed Care – PPO

## 2023-03-19 NOTE — Progress Notes (Unsigned)
03/19/2023 Name: Haley Steele MRN: [id number] DOB: [birth year]  Chief Complaint  Patient presents with   Medication Management   Haley Steele is a 52 y.o. year old female who presented for a telephone visit to follow-up on medication access/adherence and diabetes control  Subjective: Care Team: Primary Care Provider: Christen Butter, NP ; Next Scheduled Visit: 8/23  Medication Access/Adherence Current Pharmacy:  [facility] [address] ([address]), [place] - [address] [place] ([place]) [place] [address] Phone: [phone] Fax: [phone]  [id number] - [place], [place] - [address] [place] [address] Phone: [phone] Fax: [phone]  Patient reports affordability concerns with their medications: Yes  Patient reports access/transportation concerns to their pharmacy: No  Patient reports adherence concerns with their medications:  Yes    Diabetes: Current medications: metformin XR 100mg  daily, glipizide 5mg  BID, Toujeo Max 16 units daily, Jardiance 25mg  daily, Ozempic 2mg  weekly  -Patient states she alternates between taking metformin and glipizide daily due to severe GI upset when taking both daily -She currently does not have Januvia, Jardiance, or diabetic testing supplies due to the cost (even with insurance applied) -Toujeo is affordable for her, because she has a copay that makes is nearly $0 each month -Ozempic has been going through at an affordable copay for her, too; she states a refill is in process at the pharmacy now  Objective: Lab Results  Component Value Date   HGBA1C >14.0 12/25/2022   Medications Reviewed Today     Reviewed by Lenna Gilford, RPH (Pharmacist) on 03/19/23 at 1150  Med List Status: <None>   Medication Order Taking? Sig Documenting Provider Last Dose Status Informant  atorvastatin (LIPITOR) 40 MG tablet [id number] Yes Take 1 tablet (40 mg total) by mouth daily.  Christen Butter, NP Taking Active   blood glucose meter kit and supplies KIT [id number] No Dispense based on patient and insurance preference. Use up to four times daily as directed. Please include 100 lancets, 100 test strips, control solution - all to refill x99 as needed  Patient not taking: Reported on 03/19/2023   Sunnie Nielsen, DO Not Taking Active   empagliflozin (JARDIANCE) 25 MG TABS tablet [id number] No Take 1 tablet (25 mg total) by mouth daily.  Patient not taking: Reported on 03/19/2023   Christen Butter, NP Not Taking Active   glipiZIDE (GLUCOTROL) 5 MG tablet [id number] Yes Take 1 tablet (5 mg total) by mouth 2 (two) times daily before a meal. Christen Butter, NP Taking Active   glucose blood (ACCU-CHEK AVIVA PLUS) test strip [id number] No USE AS DIRECTED FOR UP TO 4 TIMES DAILY. DX: E11.65  Patient not taking: Reported on 03/19/2023   Christen Butter, NP Not Taking Active   insulin glargine, 2 Unit Dial, (TOUJEO MAX SOLOSTAR) 300 UNIT/ML Solostar Pen [id number] Yes Inject 14 Units into the skin daily. Titrate up by 2 units daily for a goal of fasting sugars of less than 130.  Maximum 50 units daily. Christen Butter, NP Taking Active            Med Note Littie Deeds, Jaedon Siler A   Fri Mar 19, 2023 11:42 AM) 16 units  Insulin Pen Needle (PEN NEEDLES) 31G X 8 MM MISC [id number] Yes Inject into skin once daily. Christen Butter, NP Taking Active   JUNEL FE 1/20 1-20 MG-MCG tablet [id number] Yes TAKE 1 TABLET BY MOUTH EVERY DAY Christen Butter, NP Taking Active   lisinopril (ZESTRIL) 2.5 MG tablet [id number] Yes  Take 1 tablet (2.5 mg total) by mouth daily. Christen Butter, NP Taking Active   metFORMIN (GLUCOPHAGE-XR) 500 MG 24 hr tablet [id number] Yes Take 2 tablets (1,000 mg total) by mouth daily. Christen Butter, NP Taking Active   OZEMPIC, 2 MG/DOSE, 8 MG/3ML SOPN [id number] No Inject 2 mg into the skin once a week.  Patient not taking: Reported on 03/19/2023   [provider] Not Taking Active            Assessment/Plan:    Diabetes: -Currently uncontrolled -Ozempic 2mg  weekly is too soon to fill until 8/12, but the pharmacy will fill on this date -Obtained a copay card for Januvia 100mg , and it is now going through for $166.11 for 90d; patient is going to request the pharmacy fill for 1 month, so this will be affordable for her -Obtained a copay card for Jardiance 25mg , and it is now going through for $10 for 90d -Recommend [employer] testing supplies OTC at [facility] or through [facility]; patient is looking into and plans to obtain  Follow Up Plan: Patient sees PCP 8/23, and Thurston Hole will f/u upon her return  Lenna Gilford, PharmD, DPLA

## 2023-03-23 NOTE — Progress Notes (Signed)
   03/23/2023  Patient ID: Haley Steele, female   DOB: Aug 20, 1970, 52 y.o.   MRN: [id number]  Incoming message from patient stating she went to pick up the Ozempic prescription that was being filled by [facility], and it was going to be >$300.  Obtained manufacturer copay card for her and sent processing information to provide to the pharmacy in a MyChart message.  Lenna Gilford, PharmD, DPLA

## 2023-03-24 ENCOUNTER — Telehealth: Payer: Self-pay

## 2023-03-24 NOTE — Progress Notes (Signed)
   03/24/2023  Patient ID: Haley Steele, female   DOB: 15-Feb-1971, 52 y.o.   MRN: [id number]  Received a message from patient that pharmacy is still not able to get the Ozempic copay card provided to process appropriately  -Contacted the copay card help desk, and all card information give to Jordan Hawks is correct, but the need to change the processing date to 8/13 or after card was activated -Contacted pharmacy, they adjusted billing date, and 3 months of Ozempic 2mg  weekly went through for $84.25 -Ozempi2mg , Januvia 25mg , and Januvia 100mg  are all ready for the patient -Contacted the patient to make her aware and discuss that Ozempic and Januvia are not recommended to take together due to increased risk of GI effects and pancreatitis -Patient is going to pick up Ozempic and Jardiance but not Januvia -Upcoming appointment with PCP 8/23.    Follow-up:  Thurston Hole to check in upon return  Lenna Gilford, PharmD, DPLA

## 2023-03-27 ENCOUNTER — Ambulatory Visit
Admission: EM | Admit: 2023-03-27 | Discharge: 2023-03-27 | Disposition: A | Discharge status: Home / Self Care | Payer: BC Managed Care – PPO | Attending: Internal Medicine | Admitting: Internal Medicine

## 2023-03-27 DIAGNOSIS — M62838 Other muscle spasm: Secondary | ICD-10-CM

## 2023-03-27 MED ORDER — KETOROLAC TROMETHAMINE 30 MG/ML IJ SOLN
30.0000 mg | Freq: Once | INTRAMUSCULAR | Status: AC
  Administered 2023-03-27: 30 mg via INTRAMUSCULAR

## 2023-03-27 MED ORDER — BACLOFEN 10 MG PO TABS: 10.0000 mg | ORAL_TABLET | Freq: Three times a day (TID) | ORAL | 0 refills | Status: DC

## 2023-03-27 NOTE — Discharge Instructions (Addendum)
Your pain is likely due to a muscle strain which will improve on its own with time.   - You may take over the counter medicines to help with pain.  - If you were given a shot of pain medicine in the clinic today, you may start taking ibuprofen/other NSAIDs in 12-24 hours. - You may also take the prescribed muscle relaxer as directed as needed for muscle aches/spasm.  Do not take this medication and drive or drink alcohol as it can make you sleepy.  Mainly use this medicine at nighttime as needed. - Apply heat 20 minutes on then 20 minutes off and perform gentle range of motion exercises to the area of greatest pain to prevent muscle stiffness and provide further pain relief.   Red flag symptoms to watch out for are numbness/tingling to the legs, weakness, loss of bowel/bladder control, and/or worsening pain that does not respond well to medicines. Follow-up with your primary care provider or return to urgent care if your symptoms do not improve in the next 3 to 4 days with medications and interventions recommended today. If your symptoms are severe (red flag), please go to the emergency room.   
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
## 2023-04-02 ENCOUNTER — Ambulatory Visit: Payer: BC Managed Care – PPO | Admitting: Medical-Surgical

## 2023-04-02 ENCOUNTER — Encounter: Payer: Self-pay | Admitting: Medical-Surgical

## 2023-04-02 VITALS — BP 148/79 | HR 92 | Ht 64.0 in | Wt 173.0 lb

## 2023-04-02 DIAGNOSIS — Z7984 Long term (current) use of oral hypoglycemic drugs: Secondary | ICD-10-CM

## 2023-04-02 DIAGNOSIS — E1165 Type 2 diabetes mellitus with hyperglycemia: Secondary | ICD-10-CM | POA: Diagnosis not present

## 2023-04-02 LAB — POCT GLYCOSYLATED HEMOGLOBIN (HGB A1C): Hemoglobin A1C: 14 % — AB (ref 4.0–5.6)

## 2023-04-02 LAB — POCT UA - MICROALBUMIN
Creatinine, POC: 10 mg/dL
Microalbumin Ur, POC: 10 mg/L

## 2023-04-02 MED ORDER — FREESTYLE LIBRE 3 SENSOR MISC: 3 refills | Status: DC

## 2023-04-02 NOTE — Progress Notes (Unsigned)
        Established patient visit  History, exam, impression, and plan:  1. Type 2 diabetes mellitus with hyperglycemia, without long-term current use of insulin [facility]) Haley Steele 52 year old female presenting for follow up on uncontrolled diabetes. Her A1c three months ago was >14%. She has been working with clinical pharmacy on getting her medications and affordability. She is currently treated with Ozempic 2mg  weekly, Metformin 500mg  daily, Glipizide 5mg  daily, and Jardiance 25mg  daily. She is also using Toujeo 16 units daily. Is supposed to be using glipizide twice daily but did not realize this until a couple of days ago. Has only been on Jardiance one week. Not checking sugars at home. Previously tried to get a CGM but the cost wasn't feasible. After discussion, she admits to eating a lot of carbohydrates on a regular basis. Recheck of A1c at >14% today. Discussed importance of dietary modification to limit carbohydrates. Microalbumin abnormal today in the setting of severely uncontrolled diabetes. Checking labs today. Samples of Freestyle LIbre 3 given today to provide glucose monitoring. Resending to the pharmacy as she has new insurance and may cover this better. Continue Ozempic, Jardiance, and Metformin as prescribed. Increase Glipizide to twice daily dosing. Titrate Toujeo up by 2 units every 3 days for fasting glucose of 90-120.  - POCT HgB A1C - HM Diabetes Foot Exam - POCT UA - Microalbumin - CBC with Differential/Platelet - CMP14+EGFR   Procedures performed this visit: None.  Return in about 3 months (around 07/03/2023) for chronic disease follow up.  __________________________________ Thayer Ohm, DNP, APRN, FNP-BC Primary Care and Sports Medicine [facility] [place]

## 2023-04-03 LAB — CBC WITH DIFFERENTIAL/PLATELET
Basophils Absolute: 0 10*3/uL (ref 0.0–0.2)
Basos: 1 %
EOS (ABSOLUTE): 0.1 10*3/uL (ref 0.0–0.4)
Eos: 1 %
Hematocrit: 42.7 % (ref 34.0–46.6)
Hemoglobin: 14.3 g/dL (ref 11.1–15.9)
Immature Grans (Abs): 0 10*3/uL (ref 0.0–0.1)
Immature Granulocytes: 0 %
Lymphocytes Absolute: 2.3 10*3/uL (ref 0.7–3.1)
Lymphs: 45 %
MCH: 30.6 pg (ref 26.6–33.0)
MCHC: 33.5 g/dL (ref 31.5–35.7)
MCV: 91 fL (ref 79–97)
Monocytes Absolute: 0.3 10*3/uL (ref 0.1–0.9)
Monocytes: 6 %
Neutrophils Absolute: 2.4 10*3/uL (ref 1.4–7.0)
Neutrophils: 47 %
Platelets: 253 10*3/uL (ref 150–450)
RBC: 4.68 x10E6/uL (ref 3.77–5.28)
RDW: 12.6 % (ref 11.7–15.4)
WBC: 5.2 10*3/uL (ref 3.4–10.8)

## 2023-04-03 LAB — CMP14+EGFR
ALT: 13 IU/L (ref 0–32)
AST: 16 IU/L (ref 0–40)
Albumin: 4.6 g/dL (ref 3.8–4.9)
Alkaline Phosphatase: 85 IU/L (ref 44–121)
BUN/Creatinine Ratio: 16 (ref 9–23)
BUN: 14 mg/dL (ref 6–24)
Bilirubin Total: <0.2 mg/dL (ref 0.0–1.2)
CO2: 20 mmol/L (ref 20–29)
Calcium: 10.1 mg/dL (ref 8.7–10.2)
Chloride: 97 mmol/L (ref 96–106)
Creatinine, Ser: 0.89 mg/dL (ref 0.57–1.00)
Globulin, Total: 3 g/dL (ref 1.5–4.5)
Glucose: 441 mg/dL — ABNORMAL HIGH (ref 70–99)
Potassium: 4.6 mmol/L (ref 3.5–5.2)
Sodium: 136 mmol/L (ref 134–144)
Total Protein: 7.6 g/dL (ref 6.0–8.5)
eGFR: 78 mL/min/{1.73_m2} (ref >59)

## 2023-04-07 ENCOUNTER — Telehealth: Payer: Self-pay

## 2023-04-07 MED ORDER — DEXCOM G7 SENSOR MISC: 1 refills | Status: DC

## 2023-04-07 NOTE — Progress Notes (Unsigned)
   04/07/2023  Patient ID: Haley Steele, female   DOB: 28-Nov-1970, 52 y.o.   MRN: [id number]  PCP prescribed Freestyle Libre 3 for CGM.    -Contacted [facility], and the prescription is not covered by insurance; but is going through on cash for $75/month -[employer], and Dexcom is covered by the plan, but they were not able to provide me with a copay -Order for Dexcom G7 sensors sent to pharmacy under [facility] CGM standing order.  Insurance paid, but this was still going to be $90/month -Sending patient a MyChart message with this information and attempting to schedule a follow-up telephone visit to discuss medications and DM control  Lenna Gilford, PharmD, DPLA

## 2023-04-19 ENCOUNTER — Other Ambulatory Visit: Payer: BC Managed Care – PPO

## 2023-04-19 NOTE — Progress Notes (Signed)
   04/19/2023  Patient ID: [name], female   DOB: 09-Nov-1970, 52 y.o.   MRN: [id number]  S/O Telephone follow-up to check on management of DM  Diabetes Management -Current regimen:  Jardiance 25mg  daily, glipizide 5mg  BID before rmeals, Toujeo 16 units daily, metformin XR 500mg  daily -Patient recently started taking glipizide 5mg  BID; had only been taking once daily -She is currently only taking 1 tablet of metformin xr 500mg  daily due to GI upset -Currently using Libre 3 for CGM -FBG 110; post-prandial BG will be 200+ -A1c 8/23 was 14.0% -She does not endorse any s/sx of hypoglyemia or hyperglycemia  A/P  Diabetes Management -Recommend patient start taking metformin xr 500mg  one tablet with breakfast and one tablet with supper; she is in agreement with this plan and will start today -She sees PCP again 12/6 and will be due for follow-up A1c at this time  Follow-up:  6 weeks  Lenna Gilford, PharmD, DPLA

## 2023-04-20 ENCOUNTER — Other Ambulatory Visit: Payer: Self-pay | Admitting: Medical-Surgical

## 2023-05-24 ENCOUNTER — Other Ambulatory Visit: Payer: BC Managed Care – PPO

## 2023-05-24 NOTE — Progress Notes (Unsigned)
   05/24/2023  Patient ID: Haley Steele, female   DOB: 1971-07-06, 52 y.o.   MRN: [id number]  S/O Telephone visit to follow-up on management of diabetes  Diabetes Management Plan -Current medications:  glipizide 5mg  BID before meals, Metformin XR 1000mg  daily, Toujeo 18 units daily, Ozempic 2mg  daily  -Patient is using Freestyle Libre 3 for CGM -Patient endorses FBG "normal" but does not provide values; she does state readings are elevated after eating -A1c in August was 14% -Needing refill on Toujeo and Jardiance; glipizide is ready for pickup at [facility].  Pharmacy states metformin has not been picked up recently and is past due.  A/P  Diabetes Management Plan -Sent patient an email link to sync [place] readings to [employer] dashboard-invite is pending -Refills pending for [employer] and Jardiance for Dr. Tamera Punt to sign if in agreement -Sending MyChart message to notify patient that glipizide is ready for pick up and inquire about need for metformin refill (available at pharmacy) -Recommend patient take metformin XR 500mg  BID to see if this helps with previously reported GI side effects -Sees PCP again 12/6 and will be due for A1c  Follow-up:  Sending MyChart message to try to schedule in 3-4 weeks  Haley Steele, PharmD, DPLA

## 2023-05-26 ENCOUNTER — Encounter: Payer: Self-pay | Admitting: Medical-Surgical

## 2023-05-26 MED ORDER — EMPAGLIFLOZIN 25 MG PO TABS: 25.0000 mg | ORAL_TABLET | Freq: Every day | ORAL | 1 refills | Status: DC

## 2023-05-26 MED ORDER — TOUJEO MAX SOLOSTAR 300 UNIT/ML ~~LOC~~ SOPN: 18.0000 [IU] | PEN_INJECTOR | Freq: Every day | SUBCUTANEOUS | 2 refills | Status: DC

## 2023-06-17 ENCOUNTER — Other Ambulatory Visit: Payer: Self-pay | Admitting: Medical-Surgical

## 2023-06-22 ENCOUNTER — Other Ambulatory Visit: Payer: BC Managed Care – PPO

## 2023-06-22 NOTE — Progress Notes (Signed)
   06/22/2023  Patient ID: Haley Steele, female   DOB: 11-27-1970, 52 y.o.   MRN: [id number]  S/O Patient outreach to follow-up on control of T2DM  Diabetes Management Plan -Current medications:  Ozempic 2mg  weekly, metformin XR 500mg  bid, glipizide 5mg  BID, Jardiance 25mg  weekly, Toujeo 18 units daily -Patient is using Freestyle Libre 3 for CGM; but has not received link sent to sync readings with PCK [employer] -Endorses FBG "in the green," but BG after eating will sometimes increase to 300+ -Patient endorses adherence to current medication regimen, and states taking metformin 1 BID has helped with stomach upset -Patient states she just returned from a cruise and does endorse dietary choices that consistently made BG elevated during vacation  A/P  Diabetes Management Plan -Patient sees PCP again for 12/6 and is due for A1c -If A1c remains >7%, I recommend either increasing glipizide to 10mg  BID OR titrating Toujeo by 2 units every 3-4 days (to prevent hypoglycemia) to FBG <130  Follow-up:  2 months  Haley Steele, PharmD, DPLA

## 2023-07-16 ENCOUNTER — Ambulatory Visit (INDEPENDENT_AMBULATORY_CARE_PROVIDER_SITE_OTHER): Payer: BC Managed Care – PPO | Admitting: Medical-Surgical

## 2023-07-16 ENCOUNTER — Encounter: Payer: Self-pay | Admitting: Medical-Surgical

## 2023-07-16 VITALS — BP 125/76 | HR 96 | Resp 20 | Ht 64.0 in | Wt 179.0 lb

## 2023-07-16 DIAGNOSIS — E1169 Type 2 diabetes mellitus with other specified complication: Secondary | ICD-10-CM | POA: Diagnosis not present

## 2023-07-16 DIAGNOSIS — E1165 Type 2 diabetes mellitus with hyperglycemia: Secondary | ICD-10-CM | POA: Diagnosis not present

## 2023-07-16 DIAGNOSIS — E1159 Type 2 diabetes mellitus with other circulatory complications: Secondary | ICD-10-CM | POA: Diagnosis not present

## 2023-07-16 DIAGNOSIS — B379 Candidiasis, unspecified: Secondary | ICD-10-CM

## 2023-07-16 DIAGNOSIS — I152 Hypertension secondary to endocrine disorders: Secondary | ICD-10-CM | POA: Diagnosis not present

## 2023-07-16 DIAGNOSIS — Z7984 Long term (current) use of oral hypoglycemic drugs: Secondary | ICD-10-CM

## 2023-07-16 DIAGNOSIS — E785 Hyperlipidemia, unspecified: Secondary | ICD-10-CM

## 2023-07-16 MED ORDER — METFORMIN HCL ER 500 MG PO TB24: 1000.0000 mg | ORAL_TABLET | Freq: Every day | ORAL | 3 refills | Status: DC

## 2023-07-16 MED ORDER — OZEMPIC (2 MG/DOSE) 8 MG/3ML ~~LOC~~ SOPN: 2.0000 mg | PEN_INJECTOR | SUBCUTANEOUS | 3 refills | Status: DC

## 2023-07-16 MED ORDER — VORICONAZOLE 200 MG PO TABS: 200.0000 mg | ORAL_TABLET | Freq: Two times a day (BID) | ORAL | 0 refills | Status: DC

## 2023-07-16 MED ORDER — GLIPIZIDE 5 MG PO TABS: 5.0000 mg | ORAL_TABLET | Freq: Two times a day (BID) | ORAL | 3 refills | Status: DC

## 2023-07-16 MED ORDER — TOUJEO MAX SOLOSTAR 300 UNIT/ML ~~LOC~~ SOPN: 18.0000 [IU] | PEN_INJECTOR | Freq: Every day | SUBCUTANEOUS | 3 refills | Status: DC

## 2023-07-16 MED ORDER — LISINOPRIL 2.5 MG PO TABS: 2.5000 mg | ORAL_TABLET | Freq: Every day | ORAL | 3 refills | Status: DC

## 2023-07-16 MED ORDER — EMPAGLIFLOZIN 25 MG PO TABS: 25.0000 mg | ORAL_TABLET | Freq: Every day | ORAL | 3 refills | Status: DC

## 2023-07-16 NOTE — Progress Notes (Unsigned)
 Established patient visit  History, exam, impression, and plan:  1. White coat syndrome with diagnosis of hypertension Very pleasant 52 year old female presenting today with a history of hypertension complicated by whitecoat hypertension.  She has been monitoring her blood pressures at home and reports that her readings have been very stable.  She has a list of home readings with her today that were taken with a blood pressure monitor that has been verified for accuracy.  Taking amlodipine 5 mg daily along with losartan-HCTZ 100-25 mg daily.  Also on propranolol 20 mg twice daily that was prescribed for essential tremor.  Tolerating all medications well without side effects.  Denies any concerning symptoms today.  As expected, her blood pressure is significantly elevated on arrival and again on recheck however her home readings look great.  No change in medications today.  Checking labs as below.  Continue home monitoring with a goal of 130/80 or less. - CBC with Differential/Platelet - CMP14+EGFR - Lipid panel - amLODipine (NORVASC) 5 MG tablet; Take 1 tablet (5 mg total) by mouth daily.  Dispense: 90 tablet; Refill: 3 - losartan-hydrochlorothiazide (HYZAAR) 100-25 MG tablet; Take 1 tablet by mouth daily.  Dispense: 90 tablet; Refill: 3 - propranolol (INDERAL) 20 MG tablet; Take 1 tablet (20 mg total) by mouth 2 (two) times daily. Take 1 tablet (20 mg) twice daily.  Dispense: 180 tablet; Refill: 3  2. Other specified hypothyroidism Currently taking levothyroxine 50 mcg daily, tolerating well without side effects.  Rechecking TSH today.  Continue levothyroxine as prescribed. - TSH  3. Benign essential tremor Was seen by neurology to further evaluate treatment options for her tremor.  Was tried on Mirapex but this was not tolerated.  Per patient, she was told that there are no other options to treat her tremor at this point.  She was restarted on propranolol 20 mg twice daily as noted  above.  Does not desire to try any other options or have a second opinion.  She is functional and able to complete daily tasks without difficulty.  Continue propranolol for now.  4. Mixed hyperlipidemia Taking Lipitor 20 mg daily, tolerating well without side effects.  Stays physically active as tolerated.  Following a low-fat heart healthy diet most of the time.  Checking lipids today.  Continue Lipitor as prescribed. - atorvastatin (LIPITOR) 20 MG tablet; Take 1 tablet (20 mg total) by mouth daily.  Dispense: 90 tablet; Refill: 3  Physical Exam Vitals reviewed.  Constitutional:      General: She is not in acute distress.    Appearance: Normal appearance. She is normal weight. She is not ill-appearing.  HENT:     Head: Normocephalic and atraumatic.  Cardiovascular:     Rate and Rhythm: Normal rate and regular rhythm.     Pulses: Normal pulses.     Heart sounds: Normal heart sounds. No murmur heard.    No friction rub. No gallop.  Pulmonary:     Effort: Pulmonary effort is normal. No respiratory distress.     Breath sounds: Normal breath sounds. No wheezing.  Skin:    General: Skin is warm and dry.  Neurological:     Mental Status: She is alert and oriented to person, place, and time.  Psychiatric:        Mood and Affect: Mood normal.        Behavior: Behavior normal.        Thought Content: Thought content normal.  Judgment: Judgment normal.    Procedures performed this visit: None.  Return in about 6 months (around 01/10/2024) for HTN follow up.  __________________________________ Thayer Ohm, DNP, APRN, FNP-BC Primary Care and Sports Medicine [facility] [place]

## 2023-07-18 ENCOUNTER — Encounter: Payer: Self-pay | Admitting: Medical-Surgical

## 2023-07-19 ENCOUNTER — Encounter: Payer: Self-pay | Admitting: Medical-Surgical
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
## 2023-07-23 ENCOUNTER — Telehealth: Payer: Self-pay

## 2023-08-05 NOTE — Telephone Encounter (Addendum)
Initiated Prior authorization [id number] 200MG  tablets Via: Covermymeds Case/Key:[id number] Status: approved  as of 08/05/23 Reason:approved through 02/01/24 Notified Pt via: Mychart

## 2023-08-05 NOTE — Telephone Encounter (Signed)
Initiated Prior authorization [id number] Aviva Plus strips Via: Covermymeds Case/Key:[id number] Status: Pending as of 08/05/23 Reason: Notified Pt via: Mychart

## 2023-08-19 ENCOUNTER — Telehealth: Payer: Self-pay

## 2023-08-19 NOTE — Progress Notes (Signed)
 Care Guide Pharmacy Note  08/19/2023 Name: Salote Weidmann MRN: [id number] DOB: [birth year]  Referred By: Willo Mini, NP Reason for referral: Care Coordination (TNM Diabetes. )   Ninel Abdella is a 53 y.o. year old female who is a primary care patient of Willo Mini, NP.  Zakyah Yanes was referred to the pharmacist for assistance related to: DMII  An unsuccessful telephone outreach was attempted today to contact the patient who was referred to the pharmacy team for assistance with diabetes. Additional attempts will be made to contact the patient.  Dreama Lynwood [facility]/VBCI-Population Health [facility] Assistant [phone]

## 2023-08-23 ENCOUNTER — Telehealth: Payer: Self-pay

## 2023-08-23 NOTE — Progress Notes (Signed)
 Care Guide Pharmacy Note  08/23/2023 Name: Tanysha Quant MRN: [id number] DOB: 1971-07-28  Referred By: Willo Mini, NP Reason for referral: Care Coordination (TNM Diabetes. )   Ladeana Laplant is a 53 y.o. year old female who is a primary care patient of Willo Mini, NP.  Stephene Alegria was referred to the pharmacist for assistance related to: DMII  A second unsuccessful telephone outreach was attempted today to contact the patient who was referred to the pharmacy team for assistance with diabetes. Additional attempts will be made to contact the patient.  Dreama Lynwood [facility]/VBCI-Population Health [facility] Assistant [phone]

## 2023-08-24 ENCOUNTER — Other Ambulatory Visit: Payer: Self-pay | Admitting: Medical-Surgical

## 2023-08-24 ENCOUNTER — Other Ambulatory Visit: Payer: Self-pay

## 2023-08-24 DIAGNOSIS — E1165 Type 2 diabetes mellitus with hyperglycemia: Secondary | ICD-10-CM

## 2023-08-24 NOTE — Progress Notes (Signed)
   08/24/2023  Patient ID: Haley Steele, female   DOB: [birth year], 53 y.o.   MRN: [id number]  Subjective Patient presents for telephone follow-up regarding type 2 diabetes management. Current medication regimen includes Jardiance  25mg  daily, glipizide  5mg  BID AC, Toujeo  18 units daily, and Ozempic  2mg  daily. Patient reports keeping Januvia  100mg  as backup when unable to obtain Ozempic  but is not taking it regularly. Previously used [employer] 3 for continuous glucose monitoring but discontinued due to $75 monthly copay. Now performing occasional finger stick glucose checks. Patient [facility] of [facility] [address]  Objective  Recent Labs/Data: - HbA1c: 9.4% (decreased from 14% in August) - Last PCP visit: 12/16  Current Medications: 1. Jardiance  25mg  daily 2. Glipizide  5mg  twice daily before meals 3. Toujeo  18 units daily 4. Ozempic  2mg  daily 5. Januvia  100mg  (PRN backup for Ozempic )  Assessment and Plan  Assessment:  Type 2 Diabetes Mellitus (E11.9) - Improved but suboptimal glycemic control with HbA1c decrease from 14% to 9.4% - Currently on appropriate multi-drug regimen - Limited glucose monitoring due to cost barriers with CGM  Plan:  1. Continue current medication regimen: - Jardiance  25mg  daily - Glipizide  5mg  BID AC - Ozempic  2mg  daily - Toujeo  18 units daily with titration protocol  2. Insulin Management: - Initiate daily fasting blood glucose monitoring - Increase Toujeo  by 2 units every 3-4 days until fasting glucose 130mg /dL  3. Medication Management: - Patient work with pharmacies to transfer [facility] [place] [facility] [address]  Follow-up:  6 weeks  Channing [name], PharmD, DPLA

## 2023-08-25 ENCOUNTER — Encounter: Payer: Self-pay | Admitting: Medical-Surgical

## 2023-08-25 DIAGNOSIS — B379 Candidiasis, unspecified: Secondary | ICD-10-CM

## 2023-08-25 MED ORDER — VORICONAZOLE 200 MG PO TABS: 200.0000 mg | ORAL_TABLET | Freq: Two times a day (BID) | ORAL | 0 refills | Status: DC

## 2023-08-26 ENCOUNTER — Telehealth: Payer: Self-pay

## 2023-08-26 NOTE — Progress Notes (Signed)
   08/26/2023  Patient ID: Haley Steele, female   DOB: 01-26-1971, 53 y.o.   MRN: [id number]  Clinic routed request from patient's primary care provider's office stating patient was requesting a phone call from me.  Contacted the patient, and she is requesting that a new Januvia [facility] [address]  Obtained new co-pay card information processing information and [facility]. Information is below for future reference.  Januvia co-pay card information [facility] [place]: [id number] Rx PCN: [facility] [id number] ID: [id number]  Lenna Gilford, PharmD, DPLA

## 2023-08-27 ENCOUNTER — Telehealth: Payer: Self-pay

## 2023-08-27 NOTE — Telephone Encounter (Signed)
Copied from CRM [id number]. Topic: General - Other >> Aug 26, 2023 12:14 PM Maxwell Marion wrote: Reason for CRM: [place] [facility], says she was speaking with someone who was providing her with a copay card for the patient's Januvia but accidentally hung up. She wasn't sure who she was speaking with but just wanted to let them know the copay card went through and the patient's copay went down to $5. She said no call back is needed.

## 2023-08-27 NOTE — Telephone Encounter (Signed)
Pharmacy has the information needed. Patient picked up prescription yesterday.

## 2023-09-07 ENCOUNTER — Encounter: Payer: Self-pay | Admitting: Medical-Surgical

## 2023-10-03 ENCOUNTER — Encounter: Payer: Self-pay | Admitting: Medical-Surgical

## 2023-10-03 DIAGNOSIS — B379 Candidiasis, unspecified: Secondary | ICD-10-CM

## 2023-10-04 ENCOUNTER — Encounter: Payer: Self-pay | Admitting: Medical-Surgical

## 2023-10-05 ENCOUNTER — Other Ambulatory Visit: Payer: Self-pay

## 2023-10-05 NOTE — Progress Notes (Signed)
   10/05/2023  Patient ID: Haley Steele, female   DOB: 10-27-1970, 53 y.o.   MRN: [id number]  Subjective/objective Telephone visit to follow-up on management of type 2 diabetes  Diabetes management plan -Current medications: Jardiance 25 mg daily, glipizide 5 mg twice daily before meals, Toujeo 24 units daily, metformin XR 500 mg twice daily, Ozempic 2 mg weekly -Patient has been titrating Toujeo by 2 units approximately weekly in an attempt to achieve FBG less than 130 -She is not currently using libre sensors, because insurance does not pay for the; and she was paying out-of-pocket.  She is using fingersticks to check home blood glucose daily. -Patient endorses fasting blood glucose readings around 120-postprandial values can go up to 200 -She does not endorse any signs or symptoms of hypoglycemia -Last A1c was 9.4% on 07/26/2023  Assessment/plan  Diabetes management plan -Moderately controlled based on current home fasting blood glucose and postprandial blood glucose readings -Recommend to continue current regimen and regular blood glucose readings at this time -Patient sees PCP again 2/28, and I recommend follow-up A1c and lipid levels at this time-patient plans to fast in anticipation of labs -If A1c remains greater than 7%, I recommend increasing glipizide to 10 mg 2 times daily with meals, or stopping glipizide and initiating mealtime insulin  Follow up: 12 weeks  Haley Steele, PharmD, DPLA

## 2023-10-06 ENCOUNTER — Other Ambulatory Visit: Payer: Self-pay | Admitting: Medical-Surgical

## 2023-10-06 DIAGNOSIS — B379 Candidiasis, unspecified: Secondary | ICD-10-CM

## 2023-10-06 MED ORDER — VORICONAZOLE 200 MG PO TABS: 200.0000 mg | ORAL_TABLET | Freq: Two times a day (BID) | ORAL | 0 refills | Status: AC

## 2023-10-07 NOTE — Progress Notes (Signed)
 I spoke with patient and canceled her appointment for 10/08/2023. I will research the patient logs to update her A1C from December.

## 2023-10-08 ENCOUNTER — Ambulatory Visit: Payer: BC Managed Care – PPO | Admitting: Medical-Surgical

## 2023-10-18 ENCOUNTER — Ambulatory Visit: Payer: BC Managed Care – PPO | Admitting: Medical-Surgical

## 2023-10-21 ENCOUNTER — Encounter: Payer: Self-pay | Admitting: Medical-Surgical

## 2023-10-21 ENCOUNTER — Ambulatory Visit (INDEPENDENT_AMBULATORY_CARE_PROVIDER_SITE_OTHER): Payer: BC Managed Care – PPO | Admitting: Medical-Surgical

## 2023-10-21 VITALS — BP 117/77 | HR 97 | Resp 20 | Ht 64.0 in | Wt 177.1 lb

## 2023-10-21 DIAGNOSIS — Z0289 Encounter for other administrative examinations: Secondary | ICD-10-CM

## 2023-10-21 DIAGNOSIS — I152 Hypertension secondary to endocrine disorders: Secondary | ICD-10-CM | POA: Diagnosis not present

## 2023-10-21 DIAGNOSIS — E1169 Type 2 diabetes mellitus with other specified complication: Secondary | ICD-10-CM

## 2023-10-21 DIAGNOSIS — E1159 Type 2 diabetes mellitus with other circulatory complications: Secondary | ICD-10-CM | POA: Diagnosis not present

## 2023-10-21 DIAGNOSIS — E1165 Type 2 diabetes mellitus with hyperglycemia: Secondary | ICD-10-CM | POA: Diagnosis not present

## 2023-10-21 DIAGNOSIS — Z7984 Long term (current) use of oral hypoglycemic drugs: Secondary | ICD-10-CM

## 2023-10-21 DIAGNOSIS — E785 Hyperlipidemia, unspecified: Secondary | ICD-10-CM

## 2023-10-21 DIAGNOSIS — Z7985 Long-term (current) use of injectable non-insulin antidiabetic drugs: Secondary | ICD-10-CM

## 2023-10-21 LAB — POCT GLYCOSYLATED HEMOGLOBIN (HGB A1C)
HbA1c, POC (controlled diabetic range): 8.8 % — AB (ref 0.0–7.0)
Hemoglobin A1C: 8.8 % — AB (ref 4.0–5.6)

## 2023-10-21 NOTE — Progress Notes (Unsigned)
        Established patient visit  History, exam, impression, and plan:  1. Type 2 diabetes mellitus with hyperglycemia, without long-term current use of insulin (HCC) (Primary) Pleasant 53 year old female presenting today with a history of type 2 diabetes that has been uncontrolled.  Her last hemoglobin A1c was 14.0% in August of last year.  She has been working with clinical pharmacy regarding medication adherence and patient assistance.  She is currently taking glipizide 5 mg twice daily, Ozempic 2 mg weekly, metformin 500 mg twice daily, and Toujeo 24 units daily.  Tolerating all medications well without side effects.  Was previously using the freestyle libre sensor however this was not financially feasible so she stopped.  She is not checking sugars at home.  Continues to have room for improvement in her dietary habits.  Recheck of hemoglobin A1c at 8.8% today indicating improvement however still not quite controlled.  Would like her to continue Ozempic and glipizide as prescribed metformin is ordered as 1000 mg twice daily so would benefit from going up to the full dose.  Strongly recommend checking glucose at least 2-3 times weekly when fasting to allow for Toujeo titration.  Continue working with clinical pharmacy as instructed. - POCT HgB A1C  2. Hyperlipidemia associated with type 2 diabetes mellitus (HCC) History of hyperlipidemia currently treated with atorvastatin 40 mg daily.  Tolerating well without side effects.  Rechecking labs today.  Continue atorvastatin as prescribed. - CMP14+EGFR - Lipid panel  3. Hypertension associated with diabetes (HCC) History of intermittently elevated blood pressures in the setting of diabetes.  She is on lisinopril 2.5 mg daily which keeps her blood pressure well-managed and provides renal protection.  Tolerating the medication well without side effects.  Not checking blood pressures at home.  Denies concerning symptoms today.  Cardiopulmonary exam is  normal.  Blood pressure at goal.  Continue lisinopril as prescribed. - CMP14+EGFR - Lipid panel  4. Encounter for completion of form with patient Biometric form brought in for completion.  Checking labs as noted above.  Once results are available, we will fax this over and notify her that it is done.  Procedures performed this visit: None.  Return in about 3 months (around 01/21/2024) for DM/HTN/HLD follow up.  __________________________________ Thayer Ohm, DNP, APRN, FNP-BC Primary Care and Sports Medicine [facility] [place]

## 2023-10-22 ENCOUNTER — Encounter: Payer: Self-pay | Admitting: Medical-Surgical

## 2023-10-23 ENCOUNTER — Encounter: Payer: Self-pay | Admitting: Medical-Surgical

## 2023-10-23 LAB — LIPID PANEL
Chol/HDL Ratio: 4.7 ratio — ABNORMAL HIGH (ref 0.0–4.4)
Cholesterol, Total: 211 mg/dL — ABNORMAL HIGH (ref 100–199)
HDL: 45 mg/dL (ref >39)
LDL Chol Calc (NIH): 142 mg/dL — ABNORMAL HIGH (ref 0–99)
Triglycerides: 136 mg/dL (ref 0–149)
VLDL Cholesterol Cal: 24 mg/dL (ref 5–40)

## 2023-10-23 LAB — CMP14+EGFR
ALT: 9 IU/L (ref 0–32)
AST: 14 IU/L (ref 0–40)
Albumin: 4.5 g/dL (ref 3.8–4.9)
Alkaline Phosphatase: 89 IU/L (ref 44–121)
BUN/Creatinine Ratio: 16 (ref 9–23)
BUN: 11 mg/dL (ref 6–24)
Bilirubin Total: <0.2 mg/dL (ref 0.0–1.2)
CO2: 16 mmol/L — ABNORMAL LOW (ref 20–29)
Calcium: 9.7 mg/dL (ref 8.7–10.2)
Chloride: 103 mmol/L (ref 96–106)
Creatinine, Ser: 0.68 mg/dL (ref 0.57–1.00)
Globulin, Total: 3 g/dL (ref 1.5–4.5)
Glucose: 98 mg/dL (ref 70–99)
Potassium: 4.5 mmol/L (ref 3.5–5.2)
Sodium: 137 mmol/L (ref 134–144)
Total Protein: 7.5 g/dL (ref 6.0–8.5)
eGFR: 105 mL/min/{1.73_m2} (ref >59)

## 2023-10-26 MED ORDER — ATORVASTATIN CALCIUM 40 MG PO TABS: 40.0000 mg | ORAL_TABLET | Freq: Every day | ORAL | 3 refills | Status: DC

## 2023-11-22 ENCOUNTER — Encounter: Payer: Self-pay | Admitting: Medical-Surgical

## 2023-11-29 ENCOUNTER — Telehealth: Payer: Self-pay

## 2023-11-29 NOTE — Progress Notes (Signed)
   11/29/2023  Patient ID: Haley Steele, female   DOB: [birth year], 53 y.o.   MRN: [id number]  Subjective/Objective:  Diabetes Medication Access/Affordability -In basket message from patient stating copays for several medications had drastically increased with current refills: Jardiance  25mg  copay $1205.82 Januvia  100mg  copay $760.46 Ozempic  2mg  copay $563.33 Metformin  XR 500mg - $74 -Patient also requesting Accu Chek Aviva strips since [employer] not affordable to maintain use  Assessment/Plan  Diabetes Medication Access/Affordability -[facility] to verify manufacturer copay cards have been applied to medications: Pharmacy does not have Jardiance  copay card on file- contacted savings program and was able to get processing information I will provide to patient in MyChart message Ozempic  now going through for $263.83 for 3 month supply Insurance and copay card applied to Januvia  already, and copay would be $760.46- patient does not use this medication unless unable to get Ozempic  Metformin  is $3.60 Accu Chek Aviva test strips are $57.50 -Contacted patient's insurance, and her plan renews 4/1 each year; so OPP of $6000 must be met- currently $3317 is remaining IF all prescriptions currently in process are picked up  Follow-up:  Sending MyChart message to inform patient and suggest getting Jardiance  25mg , Metformin  XR, Ozempic  2mg , and AccuChek if able- could request 1 month supply at a time if needed  Linn Rich, PharmD, DPLA

## 2023-12-01 ENCOUNTER — Other Ambulatory Visit: Payer: Self-pay | Admitting: Medical-Surgical

## 2023-12-02 ENCOUNTER — Other Ambulatory Visit: Payer: Self-pay | Admitting: Medical-Surgical

## 2023-12-11 ENCOUNTER — Other Ambulatory Visit: Payer: Self-pay | Admitting: Medical-Surgical

## 2023-12-14 ENCOUNTER — Telehealth: Payer: Self-pay

## 2023-12-14 NOTE — Telephone Encounter (Signed)
 Patient was identified as falling into the [place] Measure - Diabetes.   Patient was: Appointment already scheduled for:  02/04/2024.

## 2023-12-28 ENCOUNTER — Other Ambulatory Visit: Payer: Self-pay | Admitting: Medical-Surgical

## 2024-01-06 ENCOUNTER — Other Ambulatory Visit: Payer: BC Managed Care – PPO

## 2024-01-07 ENCOUNTER — Other Ambulatory Visit: Payer: Self-pay

## 2024-01-07 MED ORDER — PEN NEEDLES 31G X 8 MM MISC: 99 refills | Status: AC

## 2024-01-07 NOTE — Progress Notes (Signed)
   01/07/2024  Patient ID: Adrianna Horde, female   DOB: 10/03/1970, 53 y.o.   MRN: [id number]  Subjective/Objective: Telephone visit to address management of diabetes   Diabetes Medication Access/Affordability -Current medications:  glipizide  5mg  BID before meals, metformin  XR 500mg  BID -Because of $6000 OOP requirement with insurance resetting the beginning of April, patient has not been able to fill Jardiance , Ozempic , or Januvia  (used to take if she could not get Ozempic ) -Not using Toujeo , because she does not have pen needles- requesting refill -She also endorses copay for Toujeo  likely not affordable but has 5 pens on hand at this time -Not checking home BG due to cost of testing supplies -Last A1c 8.8% -Patient has not been able to tolerate metformin  at doses higher than 500mg  BID due to adverse GI side effects  Assessment/Plan   Diabetes Medication Access/Affordability -Uncontrolled -Pending prescription for pen needles for PCP to sign, so patient can resume Toujeo  at 24 units, increasing 2 units every 3-4 days to goal FBG <130 -Sees PCP 6/27 and is due for A1c -Will see if copay card will make Toujeo  affordable; otherwise, will contact insurance to see what alternatives will be cheaper for patient -Sending MyChart message with OTC glucometer, test strip, and lancet recommendations that will be more affordable -Check FBG daily to assist with insulin titration   Follow-up:  2 months   Linn Rich, PharmD, DPLA

## 2024-01-10 ENCOUNTER — Telehealth: Payer: Self-pay

## 2024-01-10 NOTE — Progress Notes (Signed)
   01/10/2024  Patient ID: Haley Steele, female   DOB: 05/08/1971, 53 y.o.   MRN: [id number]  [facility] Toujeo , and this does take patient's copay down to $35/month.  Patient messaged me this morning stating pen needles to inject Toujeo  were going to cost >$50, so I recommended purchasing these through [employer] along with diabetic testing supplies.  Pen needles can be purchased OTC through [employer] for approximately $10-15 for a box of 100.    Linn Rich, PharmD, DPLA

## 2024-01-11 ENCOUNTER — Other Ambulatory Visit: Payer: Self-pay | Admitting: Medical-Surgical

## 2024-01-12 ENCOUNTER — Telehealth: Payer: Self-pay

## 2024-01-12 NOTE — Progress Notes (Signed)
   01/12/2024  Patient ID: Haley Steele, female   DOB: [birth year], 53 y.o.   MRN: [id number]  [facility], and pen needles on patient's insurance would be around $50, but they sell boxes of 50 OTC for around $10 per pharmacy employee.  Patient can also purchase [facility] brand glucometer, test strips, and lancets OTC there for around $20-30 total.  Sending MyChart message to let her know and see if this will be affordable.  Linn Rich, PharmD, DPLA

## 2024-01-19 ENCOUNTER — Other Ambulatory Visit: Payer: Self-pay | Admitting: Medical-Surgical

## 2024-01-21 ENCOUNTER — Ambulatory Visit: Admitting: Medical-Surgical

## 2024-02-04 ENCOUNTER — Ambulatory Visit: Admitting: Medical-Surgical

## 2024-02-16 ENCOUNTER — Telehealth: Payer: Self-pay

## 2024-02-16 NOTE — Telephone Encounter (Signed)
 Patient was identified as falling into the [place] Measure - Diabetes.   Patient was: Appointment already scheduled for:  02/22/2024.

## 2024-02-22 ENCOUNTER — Encounter: Payer: Self-pay | Admitting: Medical-Surgical

## 2024-02-22 ENCOUNTER — Ambulatory Visit (INDEPENDENT_AMBULATORY_CARE_PROVIDER_SITE_OTHER): Admitting: Medical-Surgical

## 2024-02-22 VITALS — BP 118/73 | HR 99 | Resp 20 | Ht 64.0 in | Wt 177.0 lb

## 2024-02-22 DIAGNOSIS — E1169 Type 2 diabetes mellitus with other specified complication: Secondary | ICD-10-CM | POA: Diagnosis not present

## 2024-02-22 DIAGNOSIS — E1159 Type 2 diabetes mellitus with other circulatory complications: Secondary | ICD-10-CM | POA: Diagnosis not present

## 2024-02-22 DIAGNOSIS — Z1231 Encounter for screening mammogram for malignant neoplasm of breast: Secondary | ICD-10-CM

## 2024-02-22 DIAGNOSIS — Z7984 Long term (current) use of oral hypoglycemic drugs: Secondary | ICD-10-CM

## 2024-02-22 DIAGNOSIS — R87610 Atypical squamous cells of undetermined significance on cytologic smear of cervix (ASC-US): Secondary | ICD-10-CM | POA: Insufficient documentation

## 2024-02-22 DIAGNOSIS — I152 Hypertension secondary to endocrine disorders: Secondary | ICD-10-CM | POA: Diagnosis not present

## 2024-02-22 DIAGNOSIS — E785 Hyperlipidemia, unspecified: Secondary | ICD-10-CM

## 2024-02-22 DIAGNOSIS — E1165 Type 2 diabetes mellitus with hyperglycemia: Secondary | ICD-10-CM | POA: Diagnosis not present

## 2024-02-22 LAB — POCT GLYCOSYLATED HEMOGLOBIN (HGB A1C)
HbA1c, POC (controlled diabetic range): 10 % — AB (ref 0.0–7.0)
Hemoglobin A1C: 10 % — AB (ref 4.0–5.6)

## 2024-02-22 LAB — POCT UA - MICROALBUMIN
Creatinine, POC: 50 mg/dL
Microalbumin Ur, POC: 30 mg/L

## 2024-02-22 MED ORDER — METFORMIN HCL ER 750 MG PO TB24: 750.0000 mg | ORAL_TABLET | Freq: Two times a day (BID) | ORAL | 0 refills | Status: AC

## 2024-02-22 MED ORDER — GLIPIZIDE 10 MG PO TABS: 10.0000 mg | ORAL_TABLET | Freq: Two times a day (BID) | ORAL | 0 refills | Status: AC

## 2024-02-22 MED ORDER — ATORVASTATIN CALCIUM 40 MG PO TABS: 40.0000 mg | ORAL_TABLET | Freq: Every day | ORAL | 3 refills | Status: AC

## 2024-02-22 MED ORDER — LISINOPRIL 2.5 MG PO TABS: 2.5000 mg | ORAL_TABLET | Freq: Every day | ORAL | 3 refills | Status: AC

## 2024-02-22 NOTE — Progress Notes (Signed)
        Established patient visit  Discussed the use of AI scribe software for clinical note transcription with the patient, who gave verbal consent to proceed.  History of Present Illness   The patient presents for a follow-up regarding diabetes management.  Hyperglycemia and diabetes management - Difficulty managing blood glucose levels - Previous hemoglobin A1c of 8.8 - Frequent snacking on fruits such as watermelon may contribute to elevated glucose - Unable to check blood sugar levels due to lack of supplies - Medication regimen includes metformin  500 mg twice daily and glipizide  - Unable to afford Ozempic  or Toujeo  due to cost of pen needles and medication refills - High deductible insurance plan resets in April, complicating affordability - Currently working with a pharmacy assistance program but has not yet received an update  Preventive care and monitoring - Up to date with eye care - Mammogram scheduled for Friday - Not monitoring blood pressure at home        Physical Exam Vitals reviewed.  Constitutional:      General: She is not in acute distress.    Appearance: Normal appearance.  HENT:     Head: Normocephalic and atraumatic.  Cardiovascular:     Rate and Rhythm: Normal rate and regular rhythm.     Pulses: Normal pulses.     Heart sounds: Normal heart sounds. No murmur heard.    No friction rub. No gallop.  Pulmonary:     Effort: Pulmonary effort is normal. No respiratory distress.     Breath sounds: Normal breath sounds. No wheezing.  Skin:    General: Skin is warm and dry.  Neurological:     Mental Status: She is alert and oriented to person, place, and time.  Psychiatric:        Mood and Affect: Mood normal.        Behavior: Behavior normal.        Thought Content: Thought content normal.        Judgment: Judgment normal.     Assessment and Plan    Type 2 Diabetes Mellitus Diabetes poorly controlled with A1c of 8.8%. Medication adherence  affected by cost and availability. Educated on glycemic index of fruits. - Increase glipizide  to 10 mg twice daily with meals. - Increase metformin  to 750 mg twice daily, monitor tolerance. - Assist in obtaining pen needles for Toujeo . - Explore patient assistance programs for medication affordability. - Educate on lower glycemic fruit options like berries. - Consider plain insulin if cost issues persist.  Chronic Kidney Disease Microalbumin levels abnormal due to poorly controlled diabetes. Glycemic control essential to reduce kidney stress. - Improve glycemic control.  Hypertension On lisinopril . - Continue lisinopril  as prescribed. - Encourage home blood pressure monitoring.  Hyperlipidemia On atorvastatin  with sufficient supply until March. - Continue atorvastatin  as prescribed.  General Health Maintenance Due for mammogram, up to date with eye care. High deductible insurance affects medication affordability. - Ensure mammogram is completed. - Request updated [facility] care records from [facility].  Follow-up Follow up in three months unless issues arise sooner. - Schedule follow-up in three months. - Monitor response to increased metformin  dosage, report issues. - Communicate with [place] regarding patient assistance and medication affordability.      Return in about 3 months (around 05/24/2024) for DM follow up.  __________________________________ Zada FREDRIK Palin, DNP, APRN, FNP-BC Primary Care and Sports Medicine [facility] [place]

## 2024-02-23 ENCOUNTER — Telehealth: Payer: Self-pay

## 2024-02-23 NOTE — Progress Notes (Signed)
   02/23/2024  Patient ID: Haley Steele, female   DOB: 1970/09/08, 53 y.o.   MRN: [id number]  In basket message from patient's PCP, Zada Palin, NP, stating patient has not been able to get pen needles for Toujeo .  There is also concern with cost of Toujeo , and patient is likely without/not using medication.  A1c at recent visit had increased from 8.8% to 10%, so glipizide  has been increased to 10mg  BID and metformin  XR to 750mg  BID.  Patient did not tolerate metformin  1000mg  BID in the past, and insurance does not provide affordable coverage for GLP1 or SGLT2 medications (patient recently had change in coverage).  Contacted patient's insurance to see if there are more affordable alternatives to Toujeo , and the plan states the cheapest long-acting insulin on formulary is glargine, which would be $61/month.  Patient should currently be able to fill Toujeo  with insurance and copay card for $35/month, and glargine does not have a copay card since it is a generic medication.   Sending patient a MyChart message to follow-up in regard to Toujeo  refill, OTC pen needles and testing supplies.  Haley Steele, PharmD, DPLA

## 2024-02-25 DIAGNOSIS — Z1231 Encounter for screening mammogram for malignant neoplasm of breast: Secondary | ICD-10-CM | POA: Diagnosis not present

## 2024-02-29 LAB — HM MAMMOGRAPHY

## 2024-03-03 ENCOUNTER — Telehealth: Payer: Self-pay | Admitting: Pharmacy Technician

## 2024-03-03 NOTE — Progress Notes (Signed)
   03/03/2024  Patient ID: Haley Steele, female   DOB: January 21, 1971, 53 y.o.   MRN: [id number]  Patient engaged with clinical pharmacist for management of diabetes on 02/23/2024. Outreach by [employer] technician was requested.   Outreached patient to discuss diabetes medication management. Left voicemail for patient to return my call at their convenience.    Mette Southgate, CPhT [facility] Office: [phone] Email: [email or web address]

## 2024-03-07 ENCOUNTER — Telehealth: Payer: Self-pay | Admitting: Pharmacy Technician

## 2024-03-07 NOTE — Progress Notes (Signed)
   03/07/2024 Name: Haley Steele MRN: [id number] DOB: [birth year]  Patient is appearing on a report for [place] Metric Diabetes and last engaged with the clinical pharmacist to discuss diabetes on 01/07/2024. Contacted patient today to discuss diabetes management and completed medication review. Patient was getting ready to get on a conference call and did not have time to discuss diabetes regimen today. She informs she would speak with PharmD at her appointment this week.  Diabetes Plan from last clinical pharmacist appointment:  Assessment/Plan Diabetes Medication Access/Affordability -Uncontrolled -Pending prescription for pen needles for PCP to sign, so patient can resume Toujeo  at 24 units, increasing 2 units every 3-4 days to goal FBG <130 -Sees PCP 6/27 and is due for A1c -Will see if copay card will make Toujeo  affordable; otherwise, will contact insurance to see what alternatives will be cheaper for patient -Sending MyChart message with OTC glucometer, test strip, and lancet recommendations that will be more affordable -Check FBG daily to assist with insulin titration Follow-up:  2 months    Medication Adherence Barriers Identified:  Unable to review medications with patient today. The following fill data is from Dr First: -Jardiance  25mg  daily, 90 tabs, 90 day supply sold on 08/25/23 Januvia  100mg  daily, 90 tabs,  90 day supply sold on 1/16 Ozempic  2mg  weekly, Qty 9, 84 day supply sold on 1/28 Toujeo  Max, Qty 6 for 30 day supply sold on 11/05/23 Glipizide  5mg  twice a day, 60 tabs, 30 day supply sold on 12/20/23 Metformin  XR 750mg  twice a day, 28 tabs, 14 day supply filled on 02/22/24 Metformin  XR 500mg  twice a day, 180 tabs, 90 day supply filled on 03/02/24 Accu Chek Aviva Test Strips, Qty 200, 50 day supply sold on 10/12/23  Reminded patient of date/time of upcoming clinical pharmacist follow up and any upcoming PCP/specialists visits. Patient denies transportation barriers  to the appointment. Yes  Next clinical pharmacist appointment is scheduled for: 03/10/2024

## 2024-03-10 ENCOUNTER — Other Ambulatory Visit

## 2024-03-14 ENCOUNTER — Telehealth: Payer: Self-pay

## 2024-03-14 ENCOUNTER — Other Ambulatory Visit: Payer: Self-pay

## 2024-03-14 NOTE — Telephone Encounter (Signed)
 Copied from CRM [id number]. Topic: General - Other >> Mar 14, 2024  9:37 AM Franky GRADE wrote: Reason for CRM: Patient returning a call she received from Deanna Rosella . Patient stated she would send a mychart message. Patient missed call due to work.

## 2024-03-14 NOTE — Progress Notes (Signed)
   03/14/2024  Patient ID: Dorothyann Bush, female   DOB: 08-Dec-1970, 53 y.o.   MRN: [id number]  Subjective/Objective: Telephone visit to address management of diabetes    Diabetes Medication Access/Affordability -Current medications:  glipizide  5mg  BID before meals, metformin  XR 750 mg BID, Toujeo  24 units daily -Because of $6000 OOP requirement with insurance resetting the beginning of April, patient has not been able to fill Jardiance , Ozempic , or Januvia  (used to take if she could not get Ozempic ) -Patient endorses being able [facility], and I now using 24 units of Toujeo  daily -Has not been checking home BG regularly but does have testing supplies -Last A1c 10%, from from 8.8% -Glipizide  recently increased to 10mg  Bid, but patient has not picked dose increase up yet- continues to take 5mg  BID   Assessment/Plan   Diabetes Medication Access/Affordability -Uncontrolled -Recommend patient pick up and start glipizide  10mg  bid -Begin monitoring Fbg at least every other day, so we can adjust Toujeo  as needed -Continue metformin  XR 750mg  BID   Follow-up:  4 weeks   Channing [name], PharmD, DPLA

## 2024-03-22 ENCOUNTER — Telehealth: Payer: Self-pay

## 2024-03-22 NOTE — Telephone Encounter (Signed)
 Patient was identified as falling into the [place] Measure - Diabetes.   Patient was: Appointment already scheduled for:  05/26/2024.

## 2024-03-28 ENCOUNTER — Telehealth: Payer: Self-pay | Admitting: Pharmacy Technician

## 2024-03-28 NOTE — Progress Notes (Signed)
   03/28/2024  Patient ID: Haley Steele, female   DOB: November 29, 1970, 53 y.o.   MRN: [id number]  Patient engaged with clinical pharmacist for management of diabetes on 03/14/2024. Outreach by [employer] technician was requested.   Outreached patient to discuss diabetes medication management. Left voicemail for patient to return my call at their convenience.    Inetta Dicke, CPhT [facility] Office: [phone] Email: [email or web address]

## 2024-03-30 ENCOUNTER — Telehealth: Payer: Self-pay | Admitting: Pharmacy Technician

## 2024-03-30 NOTE — Progress Notes (Signed)
   03/30/2024  Patient ID: Haley Steele, female   DOB: 1970/10/16, 53 y.o.   MRN: [id number]  Patient engaged with clinical pharmacist for management of Diabetes on 03/14/2024. Outreach by [employer] technician was requested.   Outreached patient to discuss diabetes medication management. Left voicemail for patient to return my call at their convenience.    Haley Steele, CPhT [facility] Office: [phone] Email: [email or web address]

## 2024-04-04 ENCOUNTER — Telehealth: Payer: Self-pay | Admitting: Pharmacy Technician

## 2024-04-04 NOTE — Progress Notes (Signed)
   04/04/2024  Patient ID: Haley Steele, female   DOB: 11-Apr-1971, 53 y.o.   MRN: [id number]  Patient engaged with clinical pharmacist for management of diabetes on 03/14/2024. Outreach by [employer] technician was requested.   Outreached patient to discuss diabetes medication management. Left voicemail for patient to return my call at their convenience.   Koston Hennes, CPhT [facility] Office: [phone] Email: [email or web address]

## 2024-04-12 ENCOUNTER — Other Ambulatory Visit: Payer: Self-pay

## 2024-04-12 NOTE — Progress Notes (Unsigned)
   04/12/2024  Patient ID: Haley Steele, female   DOB: [birth year], 53 y.o.   MRN: [id number]  Outreach attempt for scheduled telephone visit to follow-up on management of diabetes was not successful.  Patient answered and requested a call back in 30 minutes, but I could not reach her when trying to call back.  I was able to leave a HIPAA compliant voicemail with my direct phone number and will send a MyChart message to attempt to reschedule visit.  Channing [name], PharmD, DPLA

## 2024-04-20 ENCOUNTER — Other Ambulatory Visit: Payer: Self-pay | Admitting: Medical-Surgical

## 2024-04-24 ENCOUNTER — Encounter: Payer: Self-pay | Admitting: Medical-Surgical

## 2024-04-24 MED ORDER — NORETHIN ACE-ETH ESTRAD-FE 1-20 MG-MCG PO TABS: 1.0000 | ORAL_TABLET | Freq: Every day | ORAL | 1 refills | Status: AC

## 2024-05-20 DIAGNOSIS — E119 Type 2 diabetes mellitus without complications: Secondary | ICD-10-CM | POA: Diagnosis not present

## 2024-05-20 DIAGNOSIS — H524 Presbyopia: Secondary | ICD-10-CM | POA: Diagnosis not present

## 2024-05-26 ENCOUNTER — Encounter: Payer: Self-pay | Admitting: Medical-Surgical

## 2024-05-26 ENCOUNTER — Ambulatory Visit: Admitting: Medical-Surgical

## 2024-05-26 VITALS — BP 129/76 | HR 99 | Resp 20 | Ht 64.0 in | Wt 181.0 lb

## 2024-05-26 DIAGNOSIS — E785 Hyperlipidemia, unspecified: Secondary | ICD-10-CM

## 2024-05-26 DIAGNOSIS — E1165 Type 2 diabetes mellitus with hyperglycemia: Secondary | ICD-10-CM | POA: Diagnosis not present

## 2024-05-26 DIAGNOSIS — E1169 Type 2 diabetes mellitus with other specified complication: Secondary | ICD-10-CM

## 2024-05-26 DIAGNOSIS — I152 Hypertension secondary to endocrine disorders: Secondary | ICD-10-CM

## 2024-05-26 DIAGNOSIS — E1159 Type 2 diabetes mellitus with other circulatory complications: Secondary | ICD-10-CM

## 2024-05-26 DIAGNOSIS — Z7985 Long-term (current) use of injectable non-insulin antidiabetic drugs: Secondary | ICD-10-CM

## 2024-05-26 LAB — POCT GLYCOSYLATED HEMOGLOBIN (HGB A1C)
HbA1c, POC (controlled diabetic range): 10.7 % — AB (ref 0.0–7.0)
Hemoglobin A1C: 10.7 % — AB (ref 4.0–5.6)

## 2024-05-26 MED ORDER — SITAGLIPTIN PHOSPHATE 100 MG PO TABS: 100.0000 mg | ORAL_TABLET | Freq: Every day | ORAL | 3 refills | Status: DC

## 2024-05-26 MED ORDER — OZEMPIC (1 MG/DOSE) 4 MG/3ML ~~LOC~~ SOPN: 1.0000 mg | PEN_INJECTOR | SUBCUTANEOUS | 0 refills | Status: DC

## 2024-05-26 MED ORDER — EMPAGLIFLOZIN 25 MG PO TABS: 25.0000 mg | ORAL_TABLET | Freq: Every day | ORAL | 3 refills | Status: DC

## 2024-05-26 NOTE — Assessment & Plan Note (Signed)
 Well-managed and stable on 2.5 mg of lisinopril  daily.  Blood pressure at goal today. - Continue lisinopril  2.5 mg daily as prescribed.

## 2024-05-26 NOTE — Assessment & Plan Note (Signed)
 Type 2 diabetes remains poorly controlled with fluctuating glucose levels. Hemoglobin A1c improved to 10.7% from 14%, but remains elevated. Current medications include glipizide , metformin , and Toujeo . Insurance issues have affected medication access, but new coverage may improve this. - Prescribe Ozempic , start at 0.5 mg, increase to 1 mg after four weeks, consider 2 mg later. - Check for Ozempic  samples for a month's supply at 0.5 mg. - Prescribe Jardiance  and Januvia . - Continue glipizide  5 mg twice daily, metformin  twice daily, and Toujeo  24 units.

## 2024-05-26 NOTE — Progress Notes (Signed)
 Established patient visit   History of Present Illness   Discussed the use of AI scribe software for clinical note transcription with the patient, who gave verbal consent to proceed.  History of Present Illness   Haley Steele is a 53 year old female with diabetes who presents for medication management and follow-up.  Type 2 diabetes - Fluctuating blood glucose levels between 110 and 200 mg/dL - Off Ozempic , Januvia , and Jardiance  since April due to insurance issues - Recently started a new insurance plan and intends to resume medications - Current medications: glipizide  5 mg twice daily, metformin  750 mg twice daily, Toujeo  24 units daily - Attempted to increase glipizide  to 10 mg twice daily but experienced adverse effects described as 'feeling funny', leading to return to lower dose  Hyperlipidemia - Taking atorvastatin  40 mg daily, well-tolerated  Hypertension - Taking lisinopril  2.5 mg daily, well-tolerated   Physical Exam   Physical Exam Vitals reviewed.  Constitutional:      General: She is not in acute distress.    Appearance: Normal appearance. She is not ill-appearing.  HENT:     Head: Normocephalic and atraumatic.  Cardiovascular:     Rate and Rhythm: Normal rate and regular rhythm.     Pulses: Normal pulses.     Heart sounds: Normal heart sounds. No murmur heard.    No friction rub. No gallop.  Pulmonary:     Effort: Pulmonary effort is normal. No respiratory distress.     Breath sounds: Normal breath sounds. No wheezing.  Skin:    General: Skin is warm and dry.  Neurological:     Mental Status: She is alert and oriented to person, place, and time.  Psychiatric:        Mood and Affect: Mood normal.        Behavior: Behavior normal.        Thought Content: Thought content normal.        Judgment: Judgment normal.    Assessment & Plan   Problem List Items Addressed This Visit       Cardiovascular and Mediastinum   Hypertension  associated with diabetes (HCC) - Primary   Well-managed and stable on 2.5 mg of lisinopril  daily.  Blood pressure at goal today. - Continue lisinopril  2.5 mg daily as prescribed.      Relevant Medications   Semaglutide , 1 MG/DOSE, (OZEMPIC , 1 MG/DOSE,) 4 MG/3ML SOPN   empagliflozin  (JARDIANCE ) 25 MG TABS tablet   sitaGLIPtin  (JANUVIA ) 100 MG tablet   Other Relevant Orders   POCT HgB A1C (Completed)   HM Diabetes Foot Exam (Completed)     Endocrine   Hyperlipidemia associated with type 2 diabetes mellitus (HCC)   Atorvastatin  taken daily for cholesterol management, well-tolerated and compliant with dosing.  Up-to-date on lipid checks. - Continue atorvastatin  40 mg daily.      Relevant Medications   Semaglutide , 1 MG/DOSE, (OZEMPIC , 1 MG/DOSE,) 4 MG/3ML SOPN   empagliflozin  (JARDIANCE ) 25 MG TABS tablet   sitaGLIPtin  (JANUVIA ) 100 MG tablet   Other Relevant Orders   POCT HgB A1C (Completed)   HM Diabetes Foot Exam (Completed)   Type 2 diabetes mellitus with hyperglycemia, without long-term current use of insulin (HCC)   Type 2 diabetes remains poorly controlled with fluctuating glucose levels. Hemoglobin A1c improved to 10.7% from 14%, but remains elevated. Current medications include glipizide , metformin , and Toujeo . Insurance issues have affected medication access, but new coverage may improve this. -  Prescribe Ozempic , start at 0.5 mg, increase to 1 mg after four weeks, consider 2 mg later. - Check for Ozempic  samples for a month's supply at 0.5 mg. - Prescribe Jardiance  and Januvia . - Continue glipizide  5 mg twice daily, metformin  twice daily, and Toujeo  24 units.       Relevant Medications   Semaglutide , 1 MG/DOSE, (OZEMPIC , 1 MG/DOSE,) 4 MG/3ML SOPN   empagliflozin  (JARDIANCE ) 25 MG TABS tablet   sitaGLIPtin  (JANUVIA ) 100 MG tablet   Other Relevant Orders   POCT HgB A1C (Completed)   HM Diabetes Foot Exam (Completed)   Follow up   Return in about 3 months (around  08/26/2024) for DM follow up. __________________________________ Zada FREDRIK Palin, DNP, APRN, FNP-BC Primary Care and Sports Medicine [facility]

## 2024-05-26 NOTE — Assessment & Plan Note (Signed)
 Atorvastatin  taken daily for cholesterol management, well-tolerated and compliant with dosing.  Up-to-date on lipid checks. - Continue atorvastatin  40 mg daily.

## 2024-05-29 ENCOUNTER — Encounter: Payer: Self-pay | Admitting: Medical-Surgical

## 2024-05-30 MED ORDER — TIRZEPATIDE 7.5 MG/0.5ML ~~LOC~~ SOAJ: 7.5000 mg | SUBCUTANEOUS | 0 refills | Status: DC

## 2024-05-30 NOTE — Telephone Encounter (Signed)
 LM for pt to return call or respond through MyChart

## 2024-05-30 NOTE — Addendum Note (Signed)
 Addended byBETHA WILLO MINI on: 05/30/2024 12:38 PM   Modules accepted: Orders

## 2024-05-31 ENCOUNTER — Ambulatory Visit (INDEPENDENT_AMBULATORY_CARE_PROVIDER_SITE_OTHER)

## 2024-05-31 DIAGNOSIS — N898 Other specified noninflammatory disorders of vagina: Secondary | ICD-10-CM

## 2024-05-31 NOTE — Progress Notes (Signed)
 Patient is in office today to do a Wet prep sample for yeast infection symptoms per Zada palin, NP denies medication changes, fever, chills.  Sample collected and sent off.

## 2024-06-01 ENCOUNTER — Ambulatory Visit: Payer: Self-pay | Admitting: Medical-Surgical

## 2024-06-01 LAB — WET PREP FOR TRICH, YEAST, CLUE
Clue Cell Exam: NEGATIVE
Trichomonas Exam: NEGATIVE
Yeast Exam: POSITIVE — AB

## 2024-06-01 MED ORDER — FLUCONAZOLE 150 MG PO TABS: 150.0000 mg | ORAL_TABLET | Freq: Once | ORAL | 0 refills | Status: AC

## 2024-06-02 ENCOUNTER — Other Ambulatory Visit: Payer: Self-pay

## 2024-06-02 NOTE — Progress Notes (Signed)
   06/02/2024  Patient ID: Haley Steele, female   DOB: 1970-10-26, 53 y.o.   MRN: [id number]  Subjective/Objective: Telephone visit to address management of diabetes    Diabetes Medication Access/Affordability -Patient recently provided with sample of Ozempic  0.25/0.5mg , and she has taken 1 dose of 0.25mg  this past week -Patient has a new insurance plan, which [facility]; and orders for Jardiance  25mg , Mounjaro 7.5mg  weekly, and Januvia  100mg  were sent to the pharmacy -Januvia  is back up in case insurance does not cover GLP1 medication -Patient states medications sent to pharmacy either are not going through on insurance or are still very expensive -A1c 10.7% at 10/17 visit with PCP -Patient currently has a yeast infection   Assessment/Plan   Diabetes Medication Access/Affordability -Uncontrolled with A1c goal <7% -Recommend patient continue Ozempic  with another week at 0.25mg , then increase to 0.5mg  weekly- complete Ozempic  pen before starting Mounjaro 7.5mg  weekly to prevent adverse GI side effects -Mounjaro (30 days) is going through on new [facility] plan for $35, and I got a copay card patient can provide to the pharmacy to see if this will help with cost -Jardiance  is also gong through on new [facility] plan for $29.99 (90 days), but I do not recommend starting this until yeast infection has cleared, BG improves, and UA verifies no ketones present in the urine -Do not use Januvia  if taking Ozempic Brena -Monitor and record BG regularly, so we can best adjust medications and know when it may be acceptable to resume Jardiance     Follow-up:  Attempted to contact patient after [facility] but had to leave a voicemail.  Sending MyChart message with this information and to schedule a telephone follow-up in 3 weeks.   Channing [name], PharmD, DPLA

## 2024-06-08 ENCOUNTER — Ambulatory Visit: Admitting: Medical-Surgical

## 2024-06-22 ENCOUNTER — Telehealth: Payer: Self-pay

## 2024-06-22 NOTE — Telephone Encounter (Signed)
 Left message for a return call. Cat is due for a mammogram unless she has had it at an outside location.

## 2024-07-05 ENCOUNTER — Other Ambulatory Visit: Payer: Self-pay

## 2024-07-05 NOTE — Progress Notes (Unsigned)
   07/05/2024  Patient ID: Haley Steele, female   DOB: 03-Jun-1971, 53 y.o.   MRN: [id number]  Outreach attempt for scheduled telephone visit was not successful.  Attempted to call and left voicemail with my direct phone number x2.  Sending MyChart message to attempt to reschedule.  Channing [name], PharmD, DPLA

## 2024-07-05 NOTE — Progress Notes (Deleted)
   07/05/2024  Patient ID: Haley Steele, female   DOB: February 21, 1971, 53 y.o.   MRN: [id number]  Subjective/Objective: Telephone visit to address management of diabetes    Diabetes Medication Access/Affordability -Patient recently provided with sample of Ozempic  0.25/0.5mg , and she has taken 1 dose of 0.25mg  this past week -Patient has a new insurance plan, which [facility]; and orders for Jardiance  25mg , Mounjaro  7.5mg  weekly, and Januvia  100mg  were sent to the pharmacy -Januvia  is back up in case insurance does not cover GLP1 medication -Patient states medications sent to pharmacy either are not going through on insurance or are still very expensive -A1c 10.7% at 10/17 visit with PCP -Patient currently has a yeast infection   Assessment/Plan   Diabetes Medication Access/Affordability -Uncontrolled with A1c goal <7% -Recommend patient continue Ozempic  with another week at 0.25mg , then increase to 0.5mg  weekly- complete Ozempic  pen before starting Mounjaro  7.5mg  weekly to prevent adverse GI side effects -Mounjaro  (30 days) is going through on new [facility] plan for $35, and I got a copay card patient can provide to the pharmacy to see if this will help with cost -Jardiance  is also gong through on new [facility] plan for $29.99 (90 days), but I do not recommend starting this until yeast infection has cleared, BG improves, and UA verifies no ketones present in the urine -Do not use Januvia  if taking Ozempic /Mounjaro  -Monitor and record BG regularly, so we can best adjust medications and know when it may be acceptable to resume Jardiance     Follow-up:  Attempted to contact patient after [facility] but had to leave a voicemail.  Sending MyChart message with this information and to schedule a telephone follow-up in 3 weeks.   Channing [name], PharmD, DPLA

## 2024-07-12 ENCOUNTER — Other Ambulatory Visit: Payer: Self-pay | Admitting: Medical-Surgical

## 2024-07-14 ENCOUNTER — Other Ambulatory Visit (HOSPITAL_COMMUNITY): Payer: Self-pay

## 2024-07-14 ENCOUNTER — Telehealth: Payer: Self-pay

## 2024-07-14 NOTE — Telephone Encounter (Signed)
 Pharmacy Patient Advocate Encounter   Received notification from Onbase that prior authorization for Mounjaro  7.5MG /0.5ML auto-injectors is required/requested.   Insurance verification completed.   The patient is insured through [facility].   Per test claim: PA required; PA submitted to above mentioned insurance via Latent Key/confirmation #/EOC [id number] Status is pending

## 2024-07-17 ENCOUNTER — Telehealth: Payer: Self-pay

## 2024-07-17 ENCOUNTER — Other Ambulatory Visit (HOSPITAL_COMMUNITY): Payer: Self-pay

## 2024-07-17 DIAGNOSIS — E1169 Type 2 diabetes mellitus with other specified complication: Secondary | ICD-10-CM

## 2024-07-17 DIAGNOSIS — E1165 Type 2 diabetes mellitus with hyperglycemia: Secondary | ICD-10-CM

## 2024-07-17 DIAGNOSIS — I152 Hypertension secondary to endocrine disorders: Secondary | ICD-10-CM

## 2024-07-17 NOTE — Telephone Encounter (Signed)
 Pharmacy Patient Advocate Encounter  Received notification from [facility] that Prior Authorization for Mounjaro  7.5MG /0.5ML auto-injectors  has been APPROVED from 07/14/24 to 07/14/25   PA #/Case ID/Reference #: [id number]

## 2024-07-17 NOTE — Progress Notes (Signed)
   07/17/2024  Patient ID: Haley Steele, female   DOB: Dec 27, 1970, 53 y.o.   MRN: [id number]  Subjective/Objective: Telephone visit to address management of diabetes    Diabetes Medication Access/Affordability -Current medications:  Mounjaro  7.5mg  weekly, glipizide  10mg  BID, metformin  XR 750mg  BID, Toujeo  20 units daily -Patient has had 3 doses of Mounjaro  7.5mg  weekly at this time and endorses tolerating well so far -Stopped taking Jardiance  25mg  daily due to recent yeast infection -Januvia  is back up in case insurance does not cover GLP1 medication -Patient is monitoring home BG and states this has been as low as a fasting reading of 109 or as high as 300 post-prandial -Does not endorse any s/sx of hypoglycemia -Recently stopped 2nd job that she was receiving additional insurance benefits from, so it is unclear if Mounjaro  will continue to be affordable -Using glipizide  5mg  tablets taking 2 tablets BID for 10mg  BID dosing -Fill history reflects the last time metformin  was filled it was for XR 500mg  tablets on 4/19 (for a 90 day supply based on taking 2 daily) -ACEi/ARB for cardiorenal protection:  lisinopril  2.5mg  daily; last OV BP was 129/76 on 10/17 -UACR 30-300 in July -Statin for ASCVD risk reduction:  atorvastatin  40mg  daily  Lab Results  Component Value Date   HGBA1C 10.7 (A) 05/26/2024   HGBA1C 10.7 (A) 05/26/2024   HGBA1C 10.0 (A) 02/22/2024   HGBA1C 10.0 (A) 02/22/2024      Component Value Date/Time   CHOL 211 (H) 10/21/2023 1543   TRIG 136 10/21/2023 1543   HDL 45 10/21/2023 1543   CHOLHDL 4.7 (H) 10/21/2023 1543   CHOLHDL 3.6 10/09/2022 0928   VLDL 14 07/03/2015 0850   LDLCALC 142 (H) 10/21/2023 1543   LDLCALC 112 (H) 10/09/2022 0928   LABVLDL 24 10/21/2023 1543   Assessment/Plan   Diabetes Medication Access/Affordability -Uncontrolled with A1c goal <7% -BP is at goal of <130/80 -UACR and LDL are not at goal -Continue Mounjaro  7.5mg  weekly, Toujeo  20  units daily, glipizide  10mg  BID -Continue to hold Jardiance  25mg  daily until A1c and BG has improved due to likelihood of recurrent genitourinary infections -Recommend starting increased metformin  dose of 750mg  BID -Continue to monitor/record home BG -Patient sees PCP again 1/19 and will be due for A1c, UACR, CMP, and lipid panel -If LDL still >70, verify adherence to medication (last time atorvastatin  appears to have been filled was in  March) and consider increasing dose to 80mg  if patient endorses adherence -If A1c remains elevated, I recommend increasing Mounjaro  to 10mg  weekly if patient continues to tolerate well and medication is still affordable   Follow-up:  Patient will notify me with any cost barriers with recent loss of secondary insurance; otherwise, I will follow-up with her after her next PCP visit in January   Channing [name], PharmD, DPLA

## 2024-07-19 ENCOUNTER — Encounter: Payer: Self-pay | Admitting: Medical-Surgical

## 2024-07-20 ENCOUNTER — Telehealth: Payer: Self-pay

## 2024-07-20 ENCOUNTER — Other Ambulatory Visit: Payer: Self-pay

## 2024-07-20 MED ORDER — MOUNJARO 7.5 MG/0.5ML ~~LOC~~ SOAJ
7.5000 mg | SUBCUTANEOUS | 0 refills | Status: DC
  Filled 2024-07-20: qty 2, 28d supply, fill #0

## 2024-07-20 NOTE — Addendum Note (Signed)
 Addended by: DEANNA ROSELLA A on: 07/20/2024 01:46 PM   Modules accepted: Orders

## 2024-07-20 NOTE — Progress Notes (Signed)
° °  07/20/2024  Patient ID: Haley Steele, female   DOB: 1970-08-25, 53 y.o.   MRN: [id number]  In basket message from patient stating cost of Mounjaro  will be over [facility].  Patient no longer has a secondary insurance plan; and her active coverage is through the [facility] plan through her employer, which has a deductible of $2000.  When [facility] bills the manufacturer card secondary to the insurance, it is only taking off $150.  Test claim reflects that evoucher will cover all of the patient's copay except for $25 that she would be responsible for at a [facility].  We can have the prescription filled at one of our locations, and she should be able to get it for $25.  Next refill will be due after the 1st of the year; patient's deductible of $2000 is likely to reset at this time, and only $923 will still be available on currently copay card.  Cards can be renewed each calendar year, though; so we may be able to get a new one that would cover all but $50 of patient's first Mounjaro  fill (assuming deductible amount is the same).  If this is the case, the medication should continue to be $25/month after the 1st fill of January.  Channing [name], PharmD, DPLA

## 2024-07-24 ENCOUNTER — Other Ambulatory Visit: Payer: Self-pay

## 2024-08-21 ENCOUNTER — Other Ambulatory Visit: Payer: Self-pay

## 2024-08-21 ENCOUNTER — Encounter: Payer: Self-pay | Admitting: Medical-Surgical

## 2024-08-21 MED ORDER — MOUNJARO 7.5 MG/0.5ML ~~LOC~~ SOAJ
7.5000 mg | SUBCUTANEOUS | 0 refills | Status: AC
  Filled 2024-08-21: qty 2, 28d supply, fill #0

## 2024-08-21 NOTE — Progress Notes (Signed)
" ° °  08/21/2024  Patient ID: Haley Steele, female   DOB: 02/07/1971, 54 y.o.   MRN: [id number]  In basket message from patient requesting refill on Mounjaro  7.5mg  weekly.  Medication was able to be filled at [facility] last month for $25, so she would like refill there.  Order pending for PCP to sign if in agreement.  Once signed, I will contact pharmacy to verify coverage/cost since it is a [place] Year, and there may have been changes with insurance.    Channing [name], PharmD, DPLA  "

## 2024-08-24 ENCOUNTER — Encounter: Payer: Self-pay | Admitting: Medical-Surgical

## 2024-08-24 ENCOUNTER — Other Ambulatory Visit: Payer: Self-pay

## 2024-08-28 ENCOUNTER — Encounter: Payer: Self-pay | Admitting: Medical-Surgical

## 2024-08-28 ENCOUNTER — Ambulatory Visit: Admitting: Medical-Surgical

## 2024-08-28 VITALS — BP 171/91 | HR 89 | Temp 98.2°F | Resp 20 | Ht 64.0 in | Wt 181.0 lb

## 2024-08-28 DIAGNOSIS — Z1329 Encounter for screening for other suspected endocrine disorder: Secondary | ICD-10-CM

## 2024-08-28 DIAGNOSIS — I152 Hypertension secondary to endocrine disorders: Secondary | ICD-10-CM | POA: Diagnosis not present

## 2024-08-28 DIAGNOSIS — E1165 Type 2 diabetes mellitus with hyperglycemia: Secondary | ICD-10-CM | POA: Diagnosis not present

## 2024-08-28 DIAGNOSIS — E1159 Type 2 diabetes mellitus with other circulatory complications: Secondary | ICD-10-CM | POA: Diagnosis not present

## 2024-08-28 DIAGNOSIS — Z1211 Encounter for screening for malignant neoplasm of colon: Secondary | ICD-10-CM | POA: Diagnosis not present

## 2024-08-28 DIAGNOSIS — Z1231 Encounter for screening mammogram for malignant neoplasm of breast: Secondary | ICD-10-CM

## 2024-08-28 LAB — POCT UA - MICROALBUMIN
Creatinine, POC: 200 mg/dL
Microalbumin Ur, POC: 80 mg/L

## 2024-08-28 LAB — POCT GLYCOSYLATED HEMOGLOBIN (HGB A1C)
HbA1c, POC (controlled diabetic range): 7.8 % — AB (ref 0.0–7.0)
Hemoglobin A1C: 7.8 % — AB (ref 4.0–5.6)

## 2024-08-28 NOTE — Progress Notes (Signed)
" ° °       Established patient visit   History of Present Illness   Discussed the use of AI scribe software for clinical note transcription with the patient, who gave verbal consent to proceed.  History of Present Illness   Haley Steele is a 54 year old female with diabetes who presents for a three-month follow-up.  Glycemic control and diabetes management - A1c was 10.7% in October - Blood glucose levels fluctuate, particularly in the mornings - Difficulty maintaining stable blood sugars over the holidays - Current diabetes medications: Mounjaro  7.5 mg, metformin  twice daily, Toujeo  18-20 units daily, glipizide  5 mg twice daily  Adverse effects of antihyperglycemic medications - Gastrointestinal symptoms attributed to diabetes medications  Hypertension and hyperlipidemia management - Takes lisinopril  2.5 mg and Lipitor 40mg  daily - Questions accuracy of today's blood pressure reading given her BP has not historically been this ele ated  Colorectal cancer screening - Completed Cologuard three years ago - Due for repeat colorectal cancer screening - No history of colonoscopy    Physical Exam   Physical Exam Vitals reviewed.  Constitutional:      General: She is not in acute distress.    Appearance: Normal appearance.  HENT:     Head: Normocephalic and atraumatic.  Cardiovascular:     Rate and Rhythm: Normal rate and regular rhythm.     Pulses: Normal pulses.     Heart sounds: Normal heart sounds. No murmur heard.    No friction rub. No gallop.  Pulmonary:     Effort: Pulmonary effort is normal. No respiratory distress.     Breath sounds: Normal breath sounds. No wheezing.  Skin:    General: Skin is warm and dry.  Neurological:     Mental Status: She is alert and oriented to person, place, and time.  Psychiatric:        Mood and Affect: Mood normal.        Behavior: Behavior normal.        Thought Content: Thought content normal.        Judgment:  Judgment normal.    Assessment & Plan   Assessment and Plan    Type 2 diabetes mellitus with hyperglycemia, without long-term current use of insulin (HCC)  A1c improved from 10.7% to 7.8%, indicating better glycemic control. Reports gastrointestinal discomfort, possibly medication-related.  - Continue current diabetes medication regimen of Mounjaro  7.5mg  weekly, Metformin  750mg  BID, Glipizide  5mg  BID, and Toujeo  18-20 units daily. - Monitor blood glucose levels regularly with fasting goal of 90-120. - Discussed potential increase in Mounjaro  dosage at next visit if gastrointestinal symptoms improve.  Hypertension associated with diabetes (HCC) Blood pressure elevated at 186/98 mmHg on arrival. Compliant with lisinopril  2.5mg  daily.  - BP rechecked- 171/91, still uncharacteristically elevated.  - Continue lisinopril  2.5mg  daily as prescribed.  - Encouraged home monitoring with a goal of less than 130/80.   Colon cancer screening Due for repeat Cologuard test as last test was completed in January 2023. No prior colonoscopy performed. - Ordered Cologuard test for colon cancer screening.   Health maintenance Due for mammogram and thyroid  disorder screening. Declined Prevnar 20 today.  - Ordering TSH.  - Mammogram ordered.    Follow up   Return in about 3 months (around 11/26/2024) for DM follow up. __________________________________ Zada FREDRIK Palin, DNP, APRN, FNP-BC [facility] [place] "

## 2024-08-29 ENCOUNTER — Ambulatory Visit: Payer: Self-pay | Admitting: Medical-Surgical

## 2024-08-29 LAB — CBC WITH DIFFERENTIAL/PLATELET
Basophils Absolute: 0 x10E3/uL (ref 0.0–0.2)
Basos: 1 %
EOS (ABSOLUTE): 0.1 x10E3/uL (ref 0.0–0.4)
Eos: 2 %
Hematocrit: 37.8 % (ref 34.0–46.6)
Hemoglobin: 12.5 g/dL (ref 11.1–15.9)
Immature Grans (Abs): 0 x10E3/uL (ref 0.0–0.1)
Immature Granulocytes: 0 %
Lymphocytes Absolute: 2.3 x10E3/uL (ref 0.7–3.1)
Lymphs: 43 %
MCH: 29.5 pg (ref 26.6–33.0)
MCHC: 33.1 g/dL (ref 31.5–35.7)
MCV: 89 fL (ref 79–97)
Monocytes Absolute: 0.4 x10E3/uL (ref 0.1–0.9)
Monocytes: 8 %
Neutrophils Absolute: 2.5 x10E3/uL (ref 1.4–7.0)
Neutrophils: 46 %
Platelets: 288 x10E3/uL (ref 150–450)
RBC: 4.24 x10E6/uL (ref 3.77–5.28)
RDW: 13 % (ref 11.7–15.4)
WBC: 5.3 x10E3/uL (ref 3.4–10.8)

## 2024-08-29 LAB — LIPID PANEL
Chol/HDL Ratio: 3.6 ratio (ref 0.0–4.4)
Cholesterol, Total: 128 mg/dL (ref 100–199)
HDL: 36 mg/dL — ABNORMAL LOW
LDL Chol Calc (NIH): 77 mg/dL (ref 0–99)
Triglycerides: 73 mg/dL (ref 0–149)
VLDL Cholesterol Cal: 15 mg/dL (ref 5–40)

## 2024-08-29 LAB — COMPREHENSIVE METABOLIC PANEL WITH GFR
ALT: 10 IU/L (ref 0–32)
AST: 15 IU/L (ref 0–40)
Albumin: 4.2 g/dL (ref 3.8–4.9)
Alkaline Phosphatase: 114 IU/L (ref 49–135)
BUN/Creatinine Ratio: 8 — ABNORMAL LOW (ref 9–23)
BUN: 5 mg/dL — ABNORMAL LOW (ref 6–24)
Bilirubin Total: 0.4 mg/dL (ref 0.0–1.2)
CO2: 21 mmol/L (ref 20–29)
Calcium: 9.1 mg/dL (ref 8.7–10.2)
Chloride: 104 mmol/L (ref 96–106)
Creatinine, Ser: 0.66 mg/dL (ref 0.57–1.00)
Globulin, Total: 2.9 g/dL (ref 1.5–4.5)
Glucose: 123 mg/dL — ABNORMAL HIGH (ref 70–99)
Potassium: 3.9 mmol/L (ref 3.5–5.2)
Sodium: 142 mmol/L (ref 134–144)
Total Protein: 7.1 g/dL (ref 6.0–8.5)
eGFR: 105 mL/min/1.73

## 2024-08-29 LAB — TSH: TSH: 1.52 u[IU]/mL (ref 0.450–4.500)

## 2024-09-08 LAB — COLOGUARD

## 2024-10-11 ENCOUNTER — Other Ambulatory Visit

## 2024-12-01 ENCOUNTER — Ambulatory Visit: Admitting: Medical-Surgical
